# Patient Record
Sex: Male | Born: 1975 | Race: Asian | Hispanic: No | Marital: Married | Smoking: Former smoker
Health system: Southern US, Community
[De-identification: ages and names within clinical notes are randomized; demographics above are authoritative.]

## PROBLEM LIST (undated history)

## (undated) DIAGNOSIS — M25562 Pain in left knee: Secondary | ICD-10-CM

## (undated) DIAGNOSIS — R197 Diarrhea, unspecified: Secondary | ICD-10-CM

## (undated) DIAGNOSIS — E119 Type 2 diabetes mellitus without complications: Secondary | ICD-10-CM

## (undated) DIAGNOSIS — B001 Herpesviral vesicular dermatitis: Secondary | ICD-10-CM

## (undated) DIAGNOSIS — G4730 Sleep apnea, unspecified: Secondary | ICD-10-CM

## (undated) DIAGNOSIS — K625 Hemorrhage of anus and rectum: Secondary | ICD-10-CM

## (undated) DIAGNOSIS — K219 Gastro-esophageal reflux disease without esophagitis: Secondary | ICD-10-CM

## (undated) DIAGNOSIS — L304 Erythema intertrigo: Secondary | ICD-10-CM

## (undated) DIAGNOSIS — Z8719 Personal history of other diseases of the digestive system: Secondary | ICD-10-CM

## (undated) DIAGNOSIS — R0683 Snoring: Secondary | ICD-10-CM

## (undated) DIAGNOSIS — J4520 Mild intermittent asthma, uncomplicated: Secondary | ICD-10-CM

## (undated) DIAGNOSIS — M25561 Pain in right knee: Secondary | ICD-10-CM

## (undated) DIAGNOSIS — F419 Anxiety disorder, unspecified: Secondary | ICD-10-CM

## (undated) DIAGNOSIS — M79639 Pain in unspecified forearm: Secondary | ICD-10-CM

## (undated) HISTORY — PX: OTHER SURGICAL HISTORY: SHX169

## (undated) HISTORY — DX: Type 2 diabetes mellitus without complications: E11.9

## (undated) HISTORY — DX: Anxiety disorder, unspecified: F41.9

---

## 2007-05-05 ENCOUNTER — Emergency Department: Payer: Self-pay | Admitting: Emergency Medicine

## 2009-06-10 ENCOUNTER — Emergency Department: Payer: Self-pay | Admitting: Emergency Medicine

## 2012-06-24 ENCOUNTER — Ambulatory Visit: Payer: Self-pay

## 2014-07-28 ENCOUNTER — Emergency Department: Payer: Self-pay | Admitting: Emergency Medicine

## 2014-08-11 DIAGNOSIS — B001 Herpesviral vesicular dermatitis: Secondary | ICD-10-CM | POA: Insufficient documentation

## 2015-10-25 DIAGNOSIS — Z6837 Body mass index (BMI) 37.0-37.9, adult: Secondary | ICD-10-CM | POA: Insufficient documentation

## 2015-10-25 DIAGNOSIS — E119 Type 2 diabetes mellitus without complications: Secondary | ICD-10-CM | POA: Insufficient documentation

## 2015-10-25 DIAGNOSIS — R030 Elevated blood-pressure reading, without diagnosis of hypertension: Secondary | ICD-10-CM | POA: Insufficient documentation

## 2016-03-05 ENCOUNTER — Encounter: Admission: RE | Payer: Self-pay | Source: Ambulatory Visit

## 2016-03-05 ENCOUNTER — Encounter: Payer: Self-pay | Admitting: Anesthesiology

## 2016-03-05 ENCOUNTER — Ambulatory Visit
Admission: RE | Admit: 2016-03-05 | Payer: BLUE CROSS/BLUE SHIELD | Source: Ambulatory Visit | Admitting: Gastroenterology

## 2016-03-05 SURGERY — COLONOSCOPY WITH PROPOFOL
Anesthesia: General

## 2016-03-08 DIAGNOSIS — K625 Hemorrhage of anus and rectum: Secondary | ICD-10-CM | POA: Insufficient documentation

## 2016-03-08 DIAGNOSIS — K529 Noninfective gastroenteritis and colitis, unspecified: Secondary | ICD-10-CM | POA: Insufficient documentation

## 2016-03-08 DIAGNOSIS — R1084 Generalized abdominal pain: Secondary | ICD-10-CM | POA: Insufficient documentation

## 2016-08-14 ENCOUNTER — Ambulatory Visit
Admission: RE | Admit: 2016-08-14 | Discharge: 2016-08-14 | Disposition: A | Discharge status: Home / Self Care | Payer: BLUE CROSS/BLUE SHIELD | Source: Ambulatory Visit | Attending: Internal Medicine | Admitting: Internal Medicine

## 2016-08-14 ENCOUNTER — Other Ambulatory Visit: Payer: Self-pay | Admitting: Internal Medicine

## 2016-08-14 DIAGNOSIS — J929 Pleural plaque without asbestos: Secondary | ICD-10-CM | POA: Insufficient documentation

## 2016-08-14 DIAGNOSIS — R1031 Right lower quadrant pain: Secondary | ICD-10-CM

## 2016-08-14 MED ORDER — IOPAMIDOL (ISOVUE-300) INJECTION 61%
100.0000 mL | Freq: Once | INTRAVENOUS | Status: AC | PRN
  Administered 2016-08-14: 100 mL via INTRAVENOUS

## 2016-11-12 ENCOUNTER — Encounter: Payer: Self-pay | Admitting: Emergency Medicine

## 2016-11-12 ENCOUNTER — Emergency Department
Admission: EM | Admit: 2016-11-12 | Discharge: 2016-11-12 | Disposition: A | Discharge status: Home / Self Care | Payer: BLUE CROSS/BLUE SHIELD | Attending: Emergency Medicine | Admitting: Emergency Medicine

## 2016-11-12 DIAGNOSIS — Z79899 Other long term (current) drug therapy: Secondary | ICD-10-CM | POA: Diagnosis not present

## 2016-11-12 DIAGNOSIS — L0231 Cutaneous abscess of buttock: Secondary | ICD-10-CM | POA: Insufficient documentation

## 2016-11-12 DIAGNOSIS — F17220 Nicotine dependence, chewing tobacco, uncomplicated: Secondary | ICD-10-CM | POA: Insufficient documentation

## 2016-11-12 DIAGNOSIS — Z7984 Long term (current) use of oral hypoglycemic drugs: Secondary | ICD-10-CM | POA: Insufficient documentation

## 2016-11-12 DIAGNOSIS — E119 Type 2 diabetes mellitus without complications: Secondary | ICD-10-CM | POA: Insufficient documentation

## 2016-11-12 HISTORY — DX: Type 2 diabetes mellitus without complications: E11.9

## 2016-11-12 LAB — BASIC METABOLIC PANEL
ANION GAP: 8 (ref 5–15)
BUN: 16 mg/dL (ref 6–20)
CALCIUM: 9.1 mg/dL (ref 8.9–10.3)
CHLORIDE: 101 mmol/L (ref 101–111)
CO2: 24 mmol/L (ref 22–32)
Creatinine, Ser: 0.74 mg/dL (ref 0.61–1.24)
GFR calc Af Amer: >60 mL/min (ref >60)
GFR calc non Af Amer: >60 mL/min (ref >60)
GLUCOSE: 132 mg/dL — AB (ref 65–99)
Potassium: 4 mmol/L (ref 3.5–5.1)
Sodium: 133 mmol/L — ABNORMAL LOW (ref 135–145)

## 2016-11-12 LAB — CBC
HEMATOCRIT: 41.1 % (ref 40.0–52.0)
HEMOGLOBIN: 13.8 g/dL (ref 13.0–18.0)
MCH: 27.9 pg (ref 26.0–34.0)
MCHC: 33.5 g/dL (ref 32.0–36.0)
MCV: 83.4 fL (ref 80.0–100.0)
Platelets: 206 10*3/uL (ref 150–440)
RBC: 4.93 MIL/uL (ref 4.40–5.90)
RDW: 13.7 % (ref 11.5–14.5)
WBC: 19 10*3/uL — AB (ref 3.8–10.6)

## 2016-11-12 LAB — URINALYSIS, COMPLETE (UACMP) WITH MICROSCOPIC
BILIRUBIN URINE: NEGATIVE
Bacteria, UA: NONE SEEN
GLUCOSE, UA: NEGATIVE mg/dL
HGB URINE DIPSTICK: NEGATIVE
KETONES UR: NEGATIVE mg/dL
LEUKOCYTES UA: NEGATIVE
NITRITE: NEGATIVE
PH: 6 (ref 5.0–8.0)
PROTEIN: NEGATIVE mg/dL
RBC / HPF: NONE SEEN RBC/hpf (ref 0–5)
Specific Gravity, Urine: 1.01 (ref 1.005–1.030)

## 2016-11-12 MED ORDER — CLINDAMYCIN PHOSPHATE 900 MG/50ML IV SOLN
900.0000 mg | Freq: Once | INTRAVENOUS | Status: AC
  Administered 2016-11-12: 900 mg via INTRAVENOUS
  Filled 2016-11-12: qty 50

## 2016-11-12 MED ORDER — IBUPROFEN 600 MG PO TABS
600.0000 mg | ORAL_TABLET | Freq: Once | ORAL | Status: AC
  Administered 2016-11-12: 600 mg via ORAL
  Filled 2016-11-12: qty 1

## 2016-11-12 MED ORDER — SODIUM CHLORIDE 0.9 % IV BOLUS (SEPSIS)
1000.0000 mL | Freq: Once | INTRAVENOUS | Status: AC
  Administered 2016-11-12: 1000 mL via INTRAVENOUS

## 2016-11-12 MED ORDER — CLINDAMYCIN HCL 300 MG PO CAPS: 300.0000 mg | ORAL_CAPSULE | Freq: Three times a day (TID) | ORAL | 0 refills | Status: AC

## 2016-11-12 NOTE — ED Provider Notes (Signed)
[facility] Emergency Department Provider Note  ____________________________________________   First MD Initiated Contact with Patient 11/12/16 [phone]     (approximate)  I have reviewed the triage vital signs and the nursing notes.   HISTORY  Chief Complaint Fever; Chills; and Abscess    HPI Ronald Sparks is a 40 y.o. male presents to the emergency department with recurring abscess on his left inner buttocks. Patient states this is been happening for several years patient states that he's noted a tenderness in this area of the past few weeks with intermittent drainage. Patient admits to fever today MAXIMUM TEMPERATURE at home 102.1 patient presents to the emergency department febrile 101.7 in the edition patient tachycardic with a heart rate of 122. She states that he notified his primary care provider of the abscess however no I&D or antibiotics was performed.   Past Medical History:  Diagnosis Date  . Diabetes mellitus without complication (HCC)     There are no active problems to display for this patient.   History reviewed. No pertinent surgical history.  Prior to Admission medications   Medication Sig Start Date End Date Taking? Authorizing Provider  albuterol (PROVENTIL HFA;VENTOLIN HFA) 108 (90 Base) MCG/ACT inhaler Inhale 2 puffs into the lungs every 4 (four) hours as needed for wheezing or shortness of breath.   Yes Historical Provider, MD  pioglitazone (ACTOS) 30 MG tablet Take 30 mg by mouth daily.   Yes Historical Provider, MD  tiZANidine (ZANAFLEX) 2 MG tablet Take 2-4 mg by mouth every 6 (six) hours as needed for muscle spasms.   Yes Historical Provider, MD  valACYclovir (VALTREX) 1000 MG tablet Take 1,000 mg by mouth once as needed.   Yes Historical Provider, MD  clindamycin (CLEOCIN) 300 MG capsule Take 1 capsule (300 mg total) by mouth 3 (three) times daily. 11/12/16 11/22/16  Darci Currentandolph N Ranessa Kosta, MD    Allergies Aspirin  History  reviewed. No pertinent family history.  Social History Social History  Substance Use Topics  . Smoking status: Never Smoker  . Smokeless tobacco: Current User    Types: Chew  . Alcohol use Yes    Review of Systems Constitutional: Positive for fever/chills Eyes: No visual changes. ENT: No sore throat. Cardiovascular: Denies chest pain. Respiratory: Denies shortness of breath. Gastrointestinal: No abdominal pain.  No nausea, no vomiting.  No diarrhea.  No constipation. Genitourinary: Negative for dysuria. Musculoskeletal: Negative for back pain. Skin: Positive for left gluteal abscess Neurological: Negative for headaches, focal weakness or numbness.  10-point ROS otherwise negative.  ____________________________________________   PHYSICAL EXAM:  VITAL SIGNS: ED Triage Vitals  Enc Vitals Group     BP 11/12/16 0252 (!) 149/80     Pulse Rate 11/12/16 0252 (!) 122     Resp 11/12/16 0430 17     Temp 11/12/16 0252 (!) 101.7 F (38.7 C)     Temp Source 11/12/16 0252 Oral     SpO2 11/12/16 0252 97 %     Weight 11/12/16 0259 268 lb (121.6 kg)     Height 11/12/16 0259 5' 7.5" (1.715 m)     Head Circumference --      Peak Flow --      Pain Score 11/12/16 0300 2     Pain Loc --      Pain Edu? --      Excl. in GC? --     Constitutional: Alert and oriented. Well appearing and in no acute distress. Eyes: Conjunctivae are normal.  PERRL. EOMI. Head: Atraumatic. Mouth/Throat: Mucous membranes are moist.  Oropharynx non-erythematous. Neck: No stridor.  Cardiovascular: Tachycardia regular rhythm. Good peripheral circulation. Grossly normal heart sounds. Respiratory: Normal respiratory effort.  No retractions. Lungs CTAB. Gastrointestinal: Soft and nontender. No distention.  Musculoskeletal: No lower extremity tenderness nor edema. No gross deformities of extremities. Neurologic:  Normal speech and language. No gross focal neurologic deficits are appreciated.  Skin:  Skin is  warm, dry. Left inferior gluteal fold 3 cm wound with scant purulent drainage. No surrounding flocculence or induration.   ____________________________________________   LABS (all labs ordered are listed, but only abnormal results are displayed)  Labs Reviewed  CBC - Abnormal; Notable for the following:       Result Value   WBC 19.0 (*)    All other components within normal limits  BASIC METABOLIC PANEL - Abnormal; Notable for the following:    Sodium 133 (*)    Glucose, Bld 132 (*)    All other components within normal limits  URINALYSIS, COMPLETE (UACMP) WITH MICROSCOPIC - Abnormal; Notable for the following:    Color, Urine YELLOW (*)    APPearance CLEAR (*)    Squamous Epithelial / LPF 0-5 (*)    All other components within normal limits  CULTURE, BLOOD (ROUTINE X 2)  CULTURE, BLOOD (ROUTINE X 2)  AEROBIC CULTURE (SUPERFICIAL SPECIMEN)     Procedures   Critical Care performed: CRITICAL CARE Performed by: Darci CurrentANDOLPH N Kjersten Ormiston   Total critical care time: 30 minutes  Critical care time was exclusive of separately billable procedures and treating other patients.  Critical care was necessary to treat or prevent imminent or life-threatening deterioration.  Critical care was time spent personally by me on the following activities: development of treatment plan with patient and/or surrogate as well as nursing, discussions with consultants, evaluation of patient's response to treatment, examination of patient, obtaining history from patient or surrogate, ordering and performing treatments and interventions, ordering and review of laboratory studies, ordering and review of radiographic studies, pulse oximetry and re-evaluation of patient's condition. ____________________________________________   INITIAL IMPRESSION / ASSESSMENT AND PLAN / ED COURSE  Pertinent labs & imaging results that were available during my care of the patient were reviewed by me and considered in my medical  decision making (see chart for details).  Given history physical exam concern for possible sepsis secondary to left gluteal abscess. Patient tachycardic febrile with leukocytosis. As such patient given IV antibiotics clindamycin 900 mg. I recommended admission to the patient however he requested to try oral antibiotics at home. Gave the patient strict precautions that would want return to the emergency department immediately. No I&D was performed as the left gluteal wound was actively draining   Clinical Course     ____________________________________________  FINAL CLINICAL IMPRESSION(S) / ED DIAGNOSES  Final diagnoses:  Abscess of buttock, left     MEDICATIONS GIVEN DURING THIS VISIT:  Medications  ibuprofen (ADVIL,MOTRIN) tablet 600 mg (600 mg Oral Given 11/12/16 0334)  sodium chloride 0.9 % bolus 1,000 mL (0 mLs Intravenous Stopped 11/12/16 0533)  clindamycin (CLEOCIN) IVPB 900 mg (0 mg Intravenous Stopped 11/12/16 0533)     NEW OUTPATIENT MEDICATIONS STARTED DURING THIS VISIT:  Discharge Medication List as of 11/12/2016  6:51 AM    START taking these medications   Details  clindamycin (CLEOCIN) 300 MG capsule Take 1 capsule (300 mg total) by mouth 3 (three) times daily., Starting Mon 11/12/2016, Until Thu 11/22/2016, Print  Discharge Medication List as of 11/12/2016  6:51 AM      Discharge Medication List as of 11/12/2016  6:51 AM       Note:  This document was prepared using Dragon voice recognition software and may include unintentional dictation errors.    Darci Current, MD 11/14/16 [phone]

## 2016-11-12 NOTE — ED Triage Notes (Signed)
Pt says he has a recurring abscess to his inner left buttock for several years; pt says the area flared up in the last few weeks and has had intermittent drainage; tender on palpation; no drainage noted at present; pt says he started having a fever about 2 hours ago, 102.1 at home;

## 2016-11-14 LAB — AEROBIC CULTURE W GRAM STAIN (SUPERFICIAL SPECIMEN)

## 2016-11-14 LAB — AEROBIC CULTURE  (SUPERFICIAL SPECIMEN): SPECIAL REQUESTS: NORMAL

## 2016-11-17 LAB — CULTURE, BLOOD (ROUTINE X 2)
CULTURE: NO GROWTH
CULTURE: NO GROWTH

## 2017-01-23 DIAGNOSIS — J4520 Mild intermittent asthma, uncomplicated: Secondary | ICD-10-CM | POA: Insufficient documentation

## 2017-01-23 DIAGNOSIS — R030 Elevated blood-pressure reading, without diagnosis of hypertension: Secondary | ICD-10-CM | POA: Insufficient documentation

## 2017-01-23 DIAGNOSIS — L304 Erythema intertrigo: Secondary | ICD-10-CM | POA: Insufficient documentation

## 2017-12-31 DIAGNOSIS — R635 Abnormal weight gain: Secondary | ICD-10-CM | POA: Insufficient documentation

## 2017-12-31 DIAGNOSIS — R0683 Snoring: Secondary | ICD-10-CM | POA: Insufficient documentation

## 2018-04-01 DIAGNOSIS — G8929 Other chronic pain: Secondary | ICD-10-CM | POA: Insufficient documentation

## 2018-04-01 DIAGNOSIS — M25511 Pain in right shoulder: Secondary | ICD-10-CM

## 2018-04-05 DIAGNOSIS — F411 Generalized anxiety disorder: Secondary | ICD-10-CM | POA: Insufficient documentation

## 2018-06-28 IMAGING — CT CT ABD-PELV W/ CM
2 of 5 series · 16 of 46 positions shown, 18 images · IV contrast (APPLIED)
Comparison: None.

CLINICAL DATA: Right lower quadrant abdominal pain for 4 days after
lifting injury.

EXAM:
CT ABDOMEN AND PELVIS WITH CONTRAST
TECHNIQUE: Multidetector CT imaging of the abdomen and pelvis was performed
using the standard protocol following bolus administration of
intravenous contrast.
CONTRAST:  100mL YNWAN9-JII IOPAMIDOL (YNWAN9-JII) INJECTION 61%

[Series 2: axial st · axial · 0.97mm/px · z∈[-431,+39]mm · 13 of 106 slices shown, 15 images]
[im 6/106  soft-tissue]
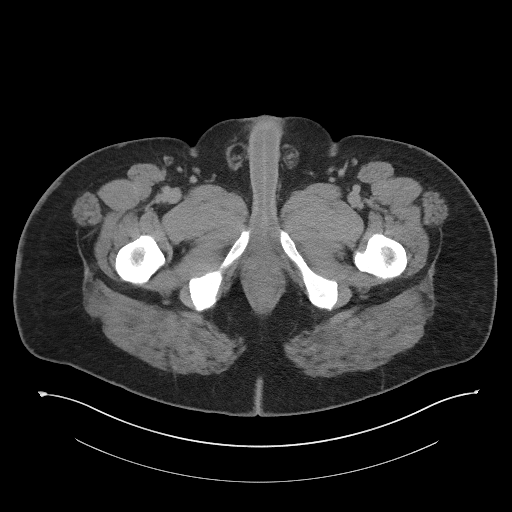
[im 6/106  bone]
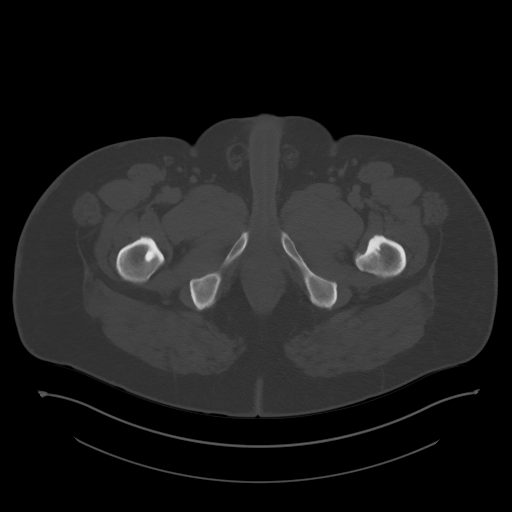
[im 17/106  soft-tissue]
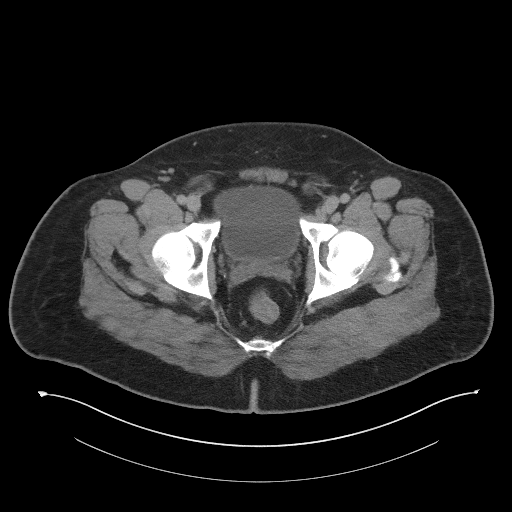
[im 23/106  soft-tissue]
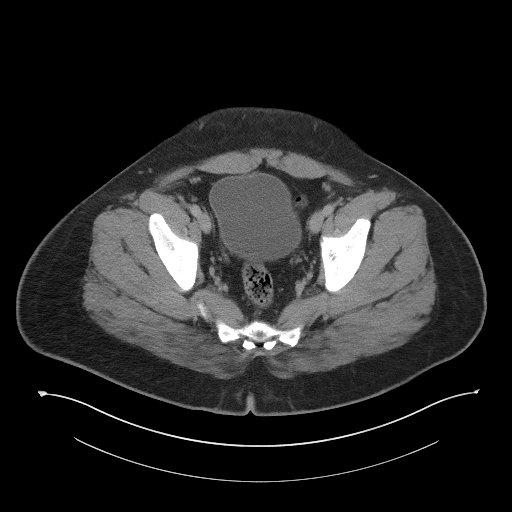
[im 28/106  soft-tissue]
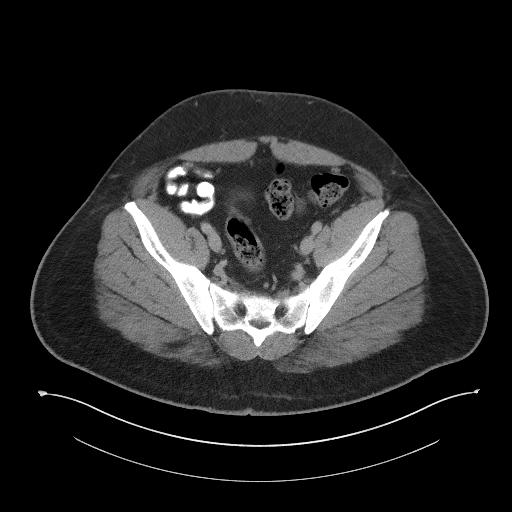
[im 39/106  soft-tissue]
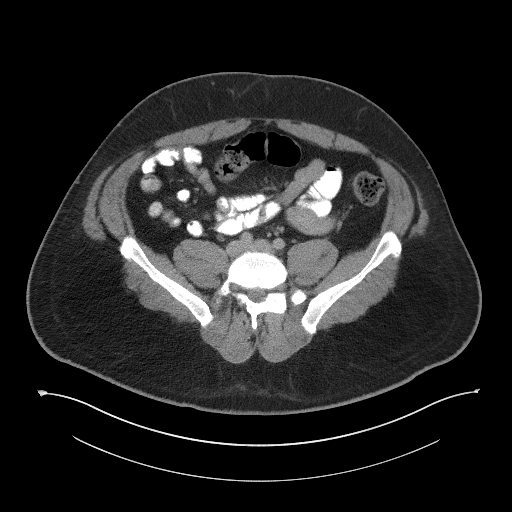
[im 45/106  soft-tissue]
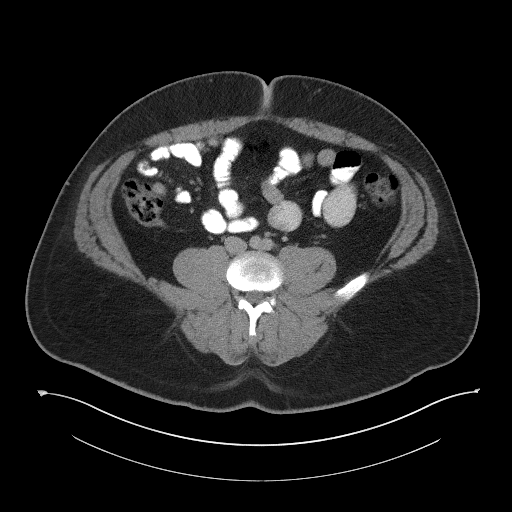
[im 56/106  soft-tissue]
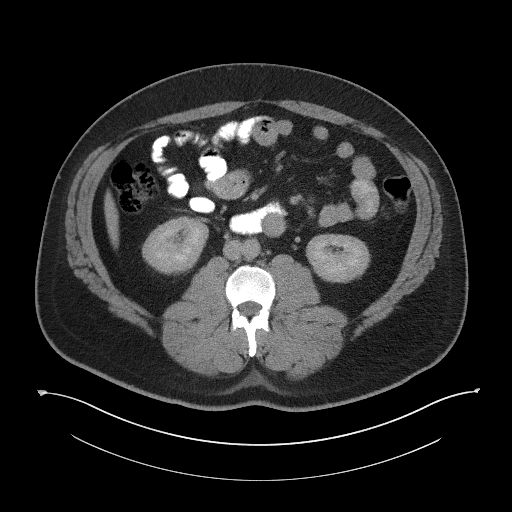
[im 61/106  soft-tissue]
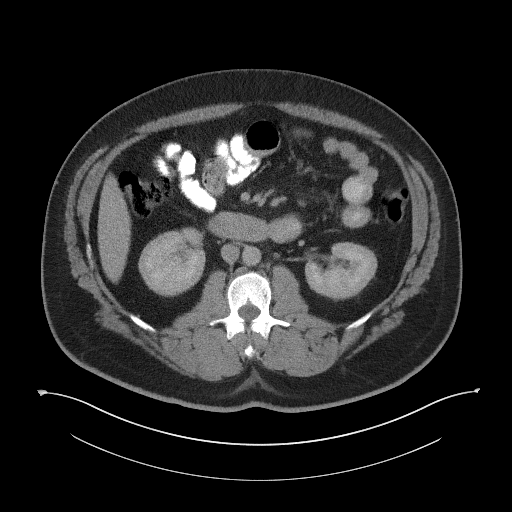
[im 67/106  soft-tissue]
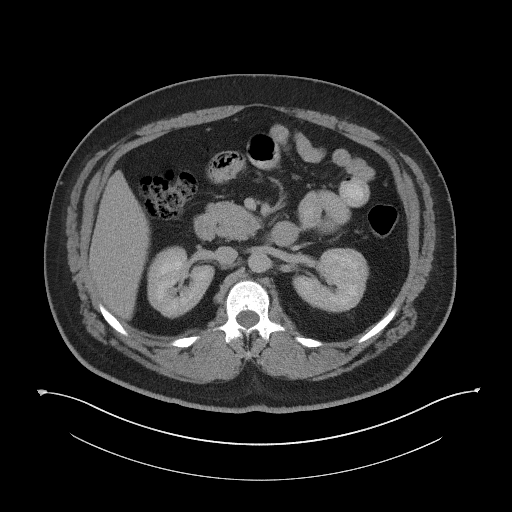
[im 67/106  bone]
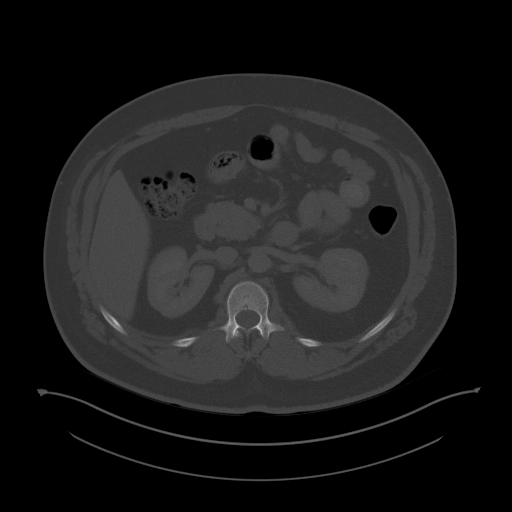
[im 78/106  soft-tissue]
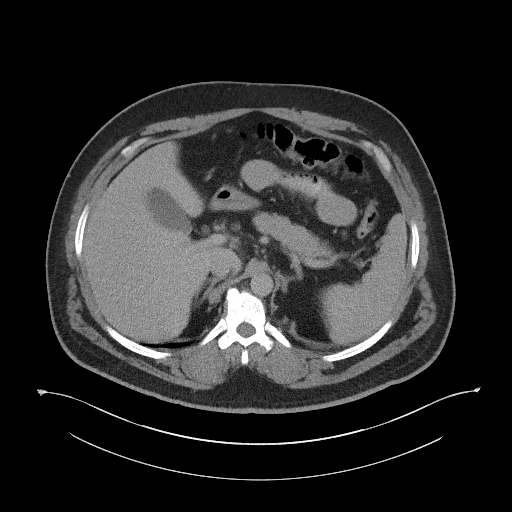
[im 83/106  soft-tissue]
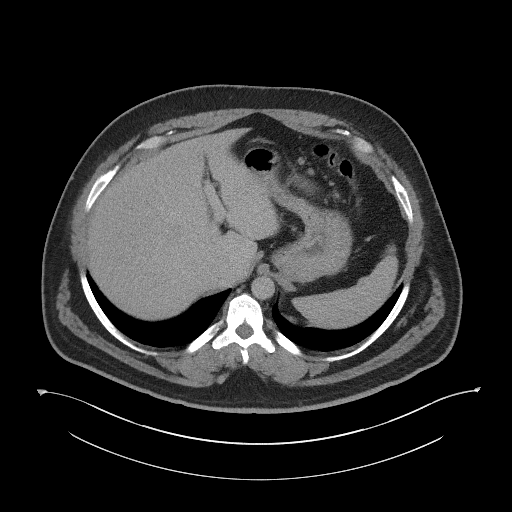
[im 89/106  soft-tissue]
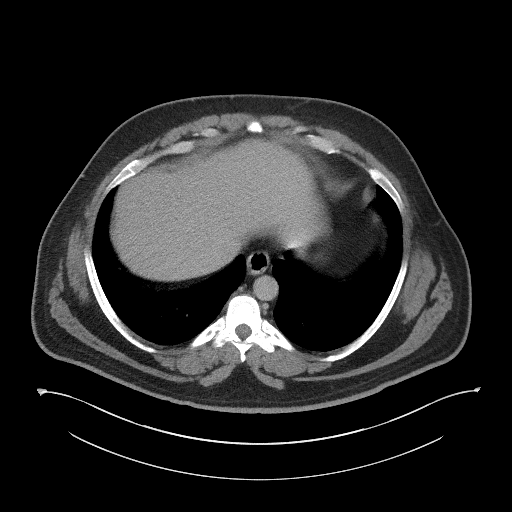
[im 100/106  soft-tissue]
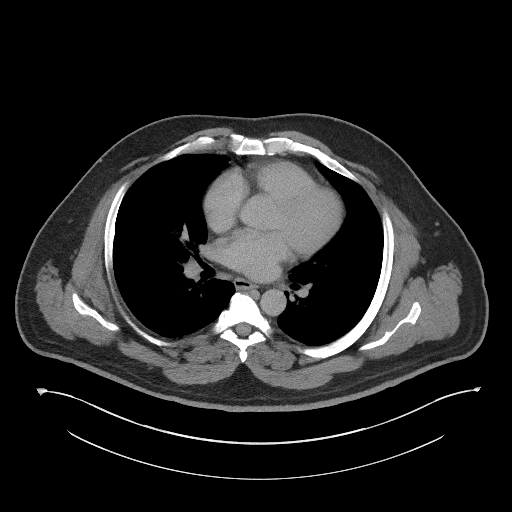

[Series 6: coronal st · coronal · 0.85mm/px · 3 of 112 slices shown]
[im 38/112  soft-tissue]
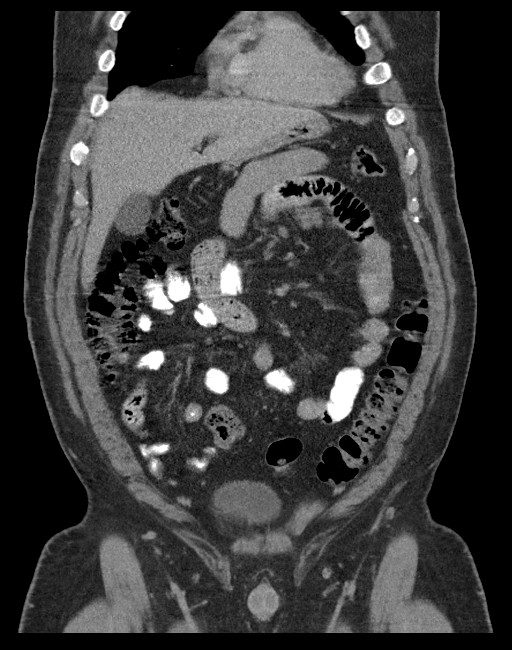
[im 50/112  soft-tissue]
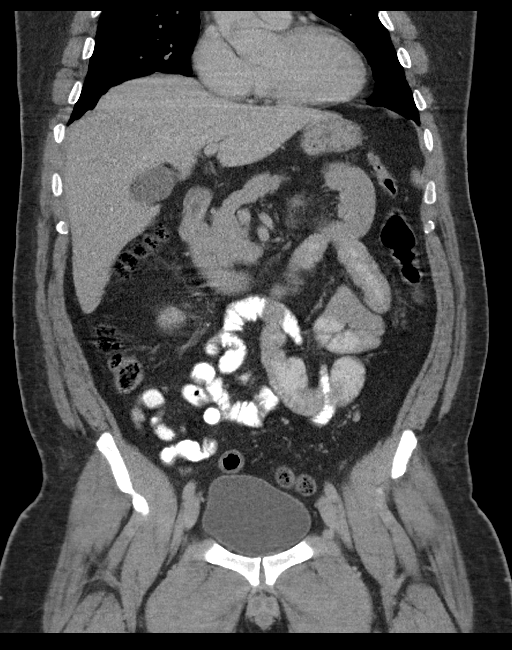
[im 62/112  soft-tissue]
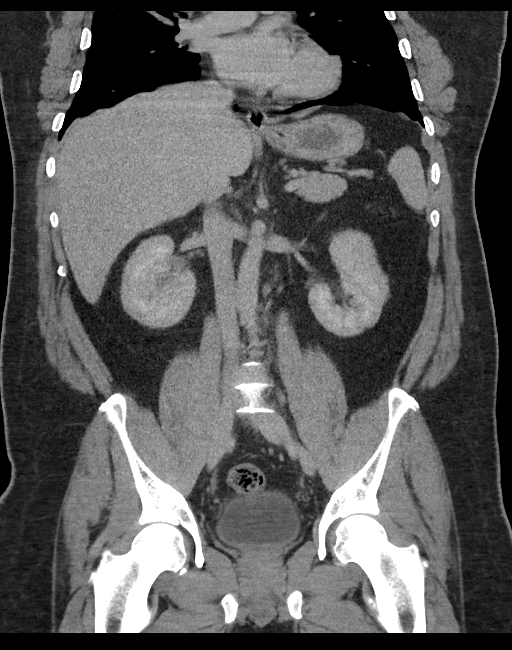

[16 of 46 positions shown; findings below may reference images not displayed]

FINDINGS: Lower chest: No significant pulmonary nodules or acute consolidative
airspace disease. Nonspecific small calcified pleural plaque at the
lateral left lung base.

Hepatobiliary: Normal liver with no liver mass. Normal gallbladder
with no radiopaque cholelithiasis. No biliary ductal dilatation.

Pancreas: Normal, with no mass or duct dilation.

Spleen: Normal size. No mass.

Adrenals/Urinary Tract: Normal adrenals. Simple 1.0 cm renal cyst in
the interpolar left kidney. No hydronephrosis. no additional renal
lesions. Normal bladder.

Stomach/Bowel: Grossly normal stomach. Normal caliber small bowel
with no small bowel wall thickening. Normal appendix. No definite
large bowel wall thickening or pericolonic fat stranding.

Vascular/Lymphatic: Normal caliber abdominal aorta. Patent portal,
splenic, hepatic and renal veins. No pathologically enlarged lymph
nodes in the abdomen or pelvis.

Reproductive: Normal size prostate and seminal vesicles.

Other: No pneumoperitoneum, ascites or focal fluid collection.

Musculoskeletal: No aggressive appearing focal osseous lesions.
Nonspecific sclerotic lesions in the bilateral pelvic girdle,
probably benign bone islands. Mild thoracolumbar spondylosis.
IMPRESSION: 1. No acute abnormality. No evidence of bowel obstruction or acute
bowel inflammation. Normal appendix.
2. Small nonspecific calcified pleural plaque at the lateral left
lung base, which could be asbestos related or due to remote left
pleural insult (infection, trauma).
These results will be called to the ordering clinician or
representative by the [HOSPITAL] at the imaging location.

## 2018-06-30 DIAGNOSIS — R197 Diarrhea, unspecified: Secondary | ICD-10-CM | POA: Insufficient documentation

## 2018-08-05 ENCOUNTER — Other Ambulatory Visit: Payer: Self-pay

## 2018-08-05 ENCOUNTER — Encounter: Payer: Self-pay | Admitting: Psychiatry

## 2018-08-05 ENCOUNTER — Ambulatory Visit: Payer: BLUE CROSS/BLUE SHIELD | Admitting: Psychiatry

## 2018-08-05 VITALS — BP 122/80 | HR 76 | Temp 97.6°F | Wt 265.2 lb

## 2018-08-05 DIAGNOSIS — F411 Generalized anxiety disorder: Secondary | ICD-10-CM

## 2018-08-05 DIAGNOSIS — G4701 Insomnia due to medical condition: Secondary | ICD-10-CM | POA: Diagnosis not present

## 2018-08-05 DIAGNOSIS — F1011 Alcohol abuse, in remission: Secondary | ICD-10-CM | POA: Diagnosis not present

## 2018-08-05 DIAGNOSIS — S335XXA Sprain of ligaments of lumbar spine, initial encounter: Secondary | ICD-10-CM | POA: Insufficient documentation

## 2018-08-05 MED ORDER — HYDROXYZINE PAMOATE 25 MG PO CAPS: 25.0000 mg | ORAL_CAPSULE | Freq: Every day | ORAL | 1 refills | Status: DC | PRN

## 2018-08-05 NOTE — Patient Instructions (Signed)
Hydroxyzine capsules or tablets What is this medicine? HYDROXYZINE (hye DROX i zeen) is an antihistamine. This medicine is used to treat allergy symptoms. It is also used to treat anxiety and tension. This medicine can be used with other medicines to induce sleep before surgery. This medicine may be used for other purposes; ask your health care provider or pharmacist if you have questions. COMMON BRAND NAME(S): ANX, Atarax, Rezine, Vistaril What should I tell my health care provider before I take this medicine? They need to know if you have any of these conditions: -any chronic illness -difficulty passing urine -glaucoma -heart disease -kidney disease -liver disease -lung disease -an unusual or allergic reaction to hydroxyzine, cetirizine, other medicines, foods, dyes, or preservatives -pregnant or trying to get pregnant -breast-feeding How should I use this medicine? Take this medicine by mouth with a full glass of water. Follow the directions on the prescription label. You may take this medicine with food or on an empty stomach. Take your medicine at regular intervals. Do not take your medicine more often than directed. Talk to your pediatrician regarding the use of this medicine in children. Special care may be needed. While this drug may be prescribed for children as young as 6 years of age for selected conditions, precautions do apply. Patients over 65 years old may have a stronger reaction and need a smaller dose. Overdosage: If you think you have taken too much of this medicine contact a poison control center or emergency room at once. NOTE: This medicine is only for you. Do not share this medicine with others. What if I miss a dose? If you miss a dose, take it as soon as you can. If it is almost time for your next dose, take only that dose. Do not take double or extra doses. What may interact with this medicine? -alcohol -barbiturate medicines for sleep or seizures -medicines for  colds, allergies -medicines for depression, anxiety, or emotional disturbances -medicines for pain -medicines for sleep -muscle relaxants This list may not describe all possible interactions. Give your health care provider a list of all the medicines, herbs, non-prescription drugs, or dietary supplements you use. Also tell them if you smoke, drink alcohol, or use illegal drugs. Some items may interact with your medicine. What should I watch for while using this medicine? Tell your doctor or health care professional if your symptoms do not improve. You may get drowsy or dizzy. Do not drive, use machinery, or do anything that needs mental alertness until you know how this medicine affects you. Do not stand or sit up quickly, especially if you are an older patient. This reduces the risk of dizzy or fainting spells. Alcohol may interfere with the effect of this medicine. Avoid alcoholic drinks. Your mouth may get dry. Chewing sugarless gum or sucking hard candy, and drinking plenty of water may help. Contact your doctor if the problem does not go away or is severe. This medicine may cause dry eyes and blurred vision. If you wear contact lenses you may feel some discomfort. Lubricating drops may help. See your eye doctor if the problem does not go away or is severe. If you are receiving skin tests for allergies, tell your doctor you are using this medicine. What side effects may I notice from receiving this medicine? Side effects that you should report to your doctor or health care professional as soon as possible: -fast or irregular heartbeat -difficulty passing urine -seizures -slurred speech or confusion -tremor Side effects that   usually do not require medical attention (report to your doctor or health care professional if they continue or are bothersome): -constipation -drowsiness -fatigue -headache -stomach upset This list may not describe all possible side effects. Call your doctor for  medical advice about side effects. You may report side effects to FDA at 1-800-FDA-1088. Where should I keep my medicine? Keep out of the reach of children. Store at room temperature between 15 and 30 degrees C (59 and 86 degrees F). Keep container tightly closed. Throw away any unused medicine after the expiration date. NOTE: This sheet is a summary. It may not cover all possible information. If you have questions about this medicine, talk to your doctor, pharmacist, or health care provider.  2018 Elsevier/Gold Standard (2008-03-19 14:50:59)  

## 2018-08-05 NOTE — Progress Notes (Signed)
Psychiatric Initial Adult Assessment   Patient Identification: Ronald Sparks MRN:  [id number] Date of Evaluation:  08/05/2018 Referral Source: Dr.Jasmine Thedore Mins Chief Complaint:  ' I am here to establish care." Chief Complaint    Establish Care; Anxiety; Stress; Agitation; Fussy     Visit Diagnosis:    ICD-10-CM   1. GAD (generalized anxiety disorder) F41.1   2. Insomnia due to medical condition G47.01   3. 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  Patient reports he has been struggling with anger issues all his life.  He reports he feels his anger issues are getting worse since the past few months.  He reports it may be also since he is struggling physically.  He was diagnosed with diabetes mellitus  and was tried on different medications.  He reports all of those medications gave him side effects like diarrhea.  He reports he hence has not been able to sleep at night since he spends a lot of time using the bathroom due to his GI issues.  Patient reports his work schedule is also complicated since some days he has to work till 4 AM and has to return to work again in the morning at 8 AM.  Patient reports since he is not able to manage his sleep schedule, that also has had an impact on his sleep.  Patient reports he has been struggling to wind down after coming back from work and has difficulty falling asleep.    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  He reports he worries about everything to the extreme.  He reports he feels anxious all the time, has been unable to stop worrying, worries too much about different things, has trouble relaxing, has been restless on and off, feels afraid something awful might happen and has irritability and anger management issues.  Patient reports his  anxiety symptoms as worsening since the past few months.  He reports he was prescribed Xanax by his primary medical doctor which he rarely takes.  Patient denies any depressive symptoms.  Patient denies any bipolar symptoms.  Patient denies any history of trauma.  Patient denies any perceptual disturbances.  Patient does have psychosocial stressors of work related stressors as well as his own medical problems.  Patient does report a history of alcoholism in his 13s.  He however reports he went into the 12-step program and does not use alcohol as much.  Associated Signs/Symptoms: Depression Symptoms:  insomnia, anxiety, (Hypo) Manic Symptoms:  Irritable Mood, Anxiety Symptoms:  Excessive Worry, Psychotic Symptoms:  denies PTSD Symptoms: Negative  Past Psychiatric History: Pt denies past treatment hx for mental illness. Denies suicide attempts.  Previous Psychotropic Medications: Yes xanax  Substance Abuse History in the last 12 months:  No.  Consequences of Substance Abuse: Negative  Past Medical History:  Past Medical History:  Diagnosis Date  . Anxiety   . Diabetes mellitus without complication (HCC)   . Diabetes mellitus, type II (HCC)    History reviewed. No pertinent surgical history.  Family Psychiatric History: Father has dementia  Family History:  Family History  Problem Relation Age of Onset  . Dementia Father     Social History:   Social History   Socioeconomic History  . Marital status: Married    Spouse name: jasmine  .  Number of children: 3  . Years of education: Not on file  . Highest education level: Some college, no degree  Occupational History  . Not on file  Social Needs  . Financial resource strain: Not hard at all  . Food insecurity:    Worry: Never true    Inability: Never true  . Transportation needs:    Medical: No    Non-medical: No  Tobacco Use  . Smoking status: Former Smoker    Types: Cigarettes    Last attempt to quit:  08/06/2007    Years since quitting: 11.0  . Smokeless tobacco: Former Neurosurgeon    Types: Chew    Quit date: 08/05/2016  Substance and Sexual Activity  . Alcohol use: Yes    Alcohol/week: 4.0 standard drinks    Types: 4 Cans of beer per week  . Drug use: No  . Sexual activity: Not on file  Lifestyle  . Physical activity:    Days per week: 0 days    Minutes per session: 0 min  . Stress: Rather much  Relationships  . Social connections:    Talks on phone: Not on file    Gets together: Not on file    Attends religious service: More than 4 times per year    Active member of club or organization: No    Attends meetings of clubs or organizations: Never    Relationship status: Married  Other Topics Concern  . Not on file  Social History Narrative  . Not on file    Additional Social History: Pt is married, lives in [place] with his wife and children.  He has 3 boys aged 17, 39, 4.  He works as a Art therapist at [employer].  He reports he had a good childhood.  Allergies:   Allergies  Allergen Reactions  . Aspirin Nausea And Vomiting    Metabolic Disorder Labs: No results found for: HGBA1C, MPG No results found for: PROLACTIN No results found for: CHOL, TRIG, HDL, CHOLHDL, VLDL, LDLCALC   Current Medications: Current Outpatient Medications  Medication Sig Dispense Refill  . Dulaglutide 0.75 MG/0.5ML SOPN Inject into the skin.    . hydrOXYzine (VISTARIL) 25 MG capsule Take 1-2 capsules (25-50 mg total) by mouth daily as needed. For anxiety and sleep 60 capsule 1  . tiZANidine (ZANAFLEX) 2 MG tablet Take 2-4 mg by mouth every 6 (six) hours as needed for muscle spasms.    . valACYclovir (VALTREX) 1000 MG tablet Take 1,000 mg by mouth once as needed.     No current facility-administered medications for this visit.     Neurologic: Headache: No Seizure: No Paresthesias:No  Musculoskeletal: Strength & Muscle Tone: within normal limits Gait & Station: normal Patient leans:  N/A  Psychiatric Specialty Exam: Review of Systems  Psychiatric/Behavioral: The patient is nervous/anxious and has insomnia.   All other systems reviewed and are negative.   Blood pressure 122/80, pulse 76, temperature 97.6 F (36.4 C), temperature source Oral, weight 265 lb 3.2 oz (120.3 kg).Body mass index is 40.92 kg/m.  General Appearance: Casual  Eye Contact:  Fair  Speech:  Normal Rate  Volume:  Normal  Mood:  Anxious  Affect:  Congruent  Thought Process:  Goal Directed and Descriptions of Associations: Intact  Orientation:  Full (Time, Place, and Person)  Thought Content:  Logical  Suicidal Thoughts:  No  Homicidal Thoughts:  No  Memory:  Immediate;   Fair Recent;   Fair Remote;   Fair  Judgement:  Fair  Insight:  Fair  Psychomotor Activity:  Normal  Concentration:  Concentration: Fair and Attention Span: Fair  Recall:  FiservFair  Fund of Knowledge:Fair  Language: Fair  Akathisia:  No  Handed:  Right  AIMS (if indicated): na  Assets:  Communication Skills Desire for Improvement Social Support  ADL's:  Intact  Cognition: WNL  Sleep:  poor    Treatment Plan Summary:Josejulian is a 42 year old male, employed, married, lives in [place], has a history of anger issues, sleep problems, diabetes mellitus, presented to the clinic today to establish care.  Patient is biologically predisposed given his history of substance abuse problems in the past, currently in remission as well as mental health problems in his family.  Patient also has psychosocial stressors of his own medical problems, side effects to medications as well as work related stressors.  Patient however is motivated to start psychotherapy.  Patient also has good social support system.  Patient denies any suicidality or homicidality.  Plan as noted below. Medication management and Plan as noted below  Plan  Generalized anxiety disorder GAD 7 equals 21 Discussed starting an SSRI however patient currently  struggles with GI problems, diarrhea due to being on medications for his diabetes mellitus.  Hence discussed with patient that since SSRIs can also cause GI issues , to wait for few weeks before starting medications like SSRIs.  Patient however reports he is not interested in medications at all right now and would like to take something as needed.  Patient is not interested in continuing the Xanax. Start hydroxyzine 25-50 mg p.o. daily as needed for severe anxiety symptoms Refer for CBT.  For insomnia Discussed sleep hygiene techniques. Patient is not interested in a sleep aid and hence discussed to continue hydroxyzine as needed.  Alcohol use disorder in sustained remission We will continue to monitor, patient uses only socially  [facility]within normal limits.  Follow-up in clinic in 4 weeks or sooner if needed.  More than 50 % of the time was spent for psychoeducation and supportive psychotherapy and care coordination.  This note was generated in part or whole with voice recognition software. Voice recognition is usually quite accurate but there are transcription errors that can and very often do occur. I apologize for any typographical errors that were not detected and corrected.       Jomarie LongsSaramma Ramesha Poster, MD 9/17/20192:44 PM

## 2018-08-13 ENCOUNTER — Encounter: Payer: Self-pay | Admitting: Licensed Clinical Social Worker

## 2018-08-13 ENCOUNTER — Ambulatory Visit: Payer: BLUE CROSS/BLUE SHIELD | Admitting: Licensed Clinical Social Worker

## 2018-08-13 DIAGNOSIS — F411 Generalized anxiety disorder: Secondary | ICD-10-CM
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
## 2018-08-28 ENCOUNTER — Ambulatory Visit: Payer: BLUE CROSS/BLUE SHIELD | Admitting: Licensed Clinical Social Worker

## 2018-09-02 ENCOUNTER — Ambulatory Visit: Payer: BLUE CROSS/BLUE SHIELD | Admitting: Psychiatry

## 2018-09-04 ENCOUNTER — Ambulatory Visit: Payer: BLUE CROSS/BLUE SHIELD | Admitting: Licensed Clinical Social Worker

## 2018-09-04 ENCOUNTER — Encounter: Payer: Self-pay | Admitting: Licensed Clinical Social Worker

## 2018-09-04 DIAGNOSIS — F411 Generalized anxiety disorder: Secondary | ICD-10-CM | POA: Diagnosis not present
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
## 2018-09-10 ENCOUNTER — Ambulatory Visit (INDEPENDENT_AMBULATORY_CARE_PROVIDER_SITE_OTHER): Payer: BLUE CROSS/BLUE SHIELD | Admitting: Psychiatry

## 2018-09-10 ENCOUNTER — Encounter: Payer: Self-pay | Admitting: Psychiatry

## 2018-09-10 ENCOUNTER — Other Ambulatory Visit: Payer: Self-pay

## 2018-09-10 VITALS — BP 118/75 | HR 79 | Temp 98.8°F | Wt 263.8 lb

## 2018-09-10 DIAGNOSIS — G4701 Insomnia due to medical condition: Secondary | ICD-10-CM

## 2018-09-10 DIAGNOSIS — F411 Generalized anxiety disorder: Secondary | ICD-10-CM | POA: Diagnosis not present

## 2018-09-10 DIAGNOSIS — F1011 Alcohol abuse, in remission: Secondary | ICD-10-CM | POA: Diagnosis not present

## 2018-09-10 NOTE — Progress Notes (Signed)
BH MD OP Progress Note  09/10/2018 11:00 AM Ronald Sparks  MRN:  [id number]  Chief Complaint:  Chief Complaint    Anxiety; Depression     ' I am here for follow up.'  HPI: Ronald Sparks is a 42 year old male, lives in [place], employed, has a history of anxiety, anger management problems, diabetes mellitus, presented to the clinic today for a follow-up visit.   Reports he has started therapy sessions with Ms. [name].  He had 2 sessions so far.  He reports he has been trying to practice what he learns at the sessions to cope with his mood lability.  He reports he believes his mood symptoms are getting better.  He continues to work on his sleep.  His work schedule is complicated since some days he has to work till 4 AM and has to return to work again in the morning at 8 AM.  Patient reports last night he got 5-6 hours of sleep which is an improvement for him.  He will continue to work on this.  He reports he has not used any of the hydroxyzine provided to him yet.  He wants to try using his coping techniques before trying any medications.  He continues to use alcohol only socially.  He denies any suicidality or homicidality.  He reports he was taken off of his diabetes medications and his blood sugar levels are better now.  He reports that has been a relief for him.  Patient denies any other concerns today.    Visit Diagnosis:    ICD-10-CM   1. GAD (generalized anxiety disorder) F41.1   2. Insomnia due to medical condition G47.01   3. 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  Past Medical History:  Past Medical History:  Diagnosis Date  . Anxiety   . Diabetes mellitus without complication (HCC)   . Diabetes mellitus, type II (HCC)    History reviewed. No pertinent surgical history.  Family Psychiatric History: Have reviewed family psychiatric history  from my progress note on 08/05/2018.  Family History:  Family History  Problem Relation Age of Onset  . Dementia Father     Social History: Have reviewed social history from my progress note on 08/05/2018. Social History   Socioeconomic History  . Marital status: Married    Spouse name: jasmine  . Number of children: 3  . Years of education: Not on file  . Highest education level: Some college, no degree  Occupational History  . Not on file  Social Needs  . Financial resource strain: Not hard at all  . Food insecurity:    Worry: Never true    Inability: Never true  . Transportation needs:    Medical: No    Non-medical: No  Tobacco Use  . Smoking status: Former Smoker    Types: Cigarettes    Last attempt to quit: 08/06/2007    Years since quitting: 11.1  . Smokeless tobacco: Former Neurosurgeon    Types: Chew    Quit date: 08/05/2016  Substance and Sexual Activity  . Alcohol use: Yes    Alcohol/week: 4.0 standard drinks    Types: 4 Cans of beer per week  . Drug use: No  . Sexual activity: Not on file  Lifestyle  . Physical activity:    Days per week: 0 days    Minutes per session: 0 min  . Stress:  Rather much  Relationships  . Social connections:    Talks on phone: Not on file    Gets together: Not on file    Attends religious service: More than 4 times per year    Active member of club or organization: No    Attends meetings of clubs or organizations: Never    Relationship status: Married  Other Topics Concern  . Not on file  Social History Narrative  . Not on file    Allergies:  Allergies  Allergen Reactions  . Aspirin Nausea And Vomiting    Metabolic Disorder Labs: No results found for: HGBA1C, MPG No results found for: PROLACTIN No results found for: CHOL, TRIG, HDL, CHOLHDL, VLDL, LDLCALC No results found for: TSH  Therapeutic Level Labs: No results found for: LITHIUM No results found for: VALPROATE No components found for:  CBMZ  Current  Medications: Current Outpatient Medications  Medication Sig Dispense Refill  . hydrOXYzine (VISTARIL) 25 MG capsule Take 1-2 capsules (25-50 mg total) by mouth daily as needed. For anxiety and sleep 60 capsule 1  . tiZANidine (ZANAFLEX) 2 MG tablet Take 2-4 mg by mouth every 6 (six) hours as needed for muscle spasms.    . valACYclovir (VALTREX) 1000 MG tablet Take 1,000 mg by mouth once as needed.     No current facility-administered medications for this visit.      Musculoskeletal: Strength & Muscle Tone: within normal limits Gait & Station: normal Patient leans: N/A  Psychiatric Specialty Exam: Review of Systems  Psychiatric/Behavioral: The patient is nervous/anxious and has insomnia.   All other systems reviewed and are negative.   Blood pressure 118/75, pulse 79, temperature 98.8 F (37.1 C), temperature source Oral, weight 263 lb 12.8 oz (119.7 kg).Body mass index is 40.71 kg/m.  General Appearance: Casual  Eye Contact:  Fair  Speech:  Clear and Coherent  Volume:  Normal  Mood:  Anxious  Affect:  Appropriate  Thought Process:  Goal Directed and Descriptions of Associations: Intact  Orientation:  Full (Time, Place, and Person)  Thought Content: Logical   Suicidal Thoughts:  No  Homicidal Thoughts:  No  Memory:  Immediate;   Fair Recent;   Fair Remote;   Fair  Judgement:  Fair  Insight:  Fair  Psychomotor Activity:  Normal  Concentration:  Concentration: Fair and Attention Span: Fair  Recall:  [employer] of Knowledge: Fair  Language: Fair  Akathisia:  No  Handed:  Right  AIMS (if indicated):na  Assets:  Communication Skills Desire for Improvement Housing Social Support Talents/Skills Transportation Vocational/Educational  ADL's:  Intact  Cognition: WNL  Sleep:  restless   Screenings:   Assessment and Plan: Ronald Sparks is a 42 year old male, employed, married, lives in [place], has a history of anger issues, sleep problem, diabetes mellitus, presented  to the clinic today for a follow-up visit.  Patient is biologically predisposed given his history of substance abuse problems in the past currently in remission as well as mental health problems in his family.  He also has psychosocial stressors of his own medical problems, side effects to medications, work-related stressors and so on.  Patient currently is no longer on diabetes medication which is a relief for him.  Patient has started psychotherapy sessions. Pt reports psychotherapy sessions is beneficial.  He also also working on his sleep schedule.  He currently denies any suicidality or homicidality.  Will continue plan as noted below.  Plan Generalized anxiety disorder Patient is not interested in an  SSRI at this time. Continue hydroxyzine as needed.  He has not used any hydroxyzine yet.  Insomnia Discussed sleep hygiene techniques. Patient will continue to work on his sleep schedule. He also has hydroxyzine as needed .  Alcohol use disorder in sustained remission We will continue to monitor, patient uses only socially.  Pt will continue psychotherapy sessions with Ms. Tasia Catchings.  Follow-up in clinic in 1 month or sooner if needed.  More than 50 % of the time was spent for psychoeducation and supportive psychotherapy and care coordination.  This note was generated in part or whole with voice recognition software. Voice recognition is usually quite accurate but there are transcription errors that can and very often do occur. I apologize for any typographical errors that were not detected and corrected.        Jomarie Longs, MD 09/10/2018, 11:00 AM

## 2018-10-01 ENCOUNTER — Ambulatory Visit: Payer: BLUE CROSS/BLUE SHIELD | Admitting: Licensed Clinical Social Worker

## 2018-10-01 ENCOUNTER — Encounter: Payer: Self-pay | Admitting: Licensed Clinical Social Worker

## 2018-10-01 DIAGNOSIS — F411 Generalized anxiety disorder: Secondary | ICD-10-CM | POA: Diagnosis not present

## 2018-10-01 NOTE — Progress Notes (Signed)
   THERAPIST PROGRESS NOTE  Session Time: 130  Participation Level: Active  Behavioral Response: NeatAlertNA  Type of Therapy: Individual Therapy  Treatment Goals addressed: Anger  Interventions: CBT  Summary: Ronald Sparks is a 42 y.o. male who presents with symptoms related to his diagnosis. Ronald Sparks reports things have been going well over the last month, and "no real anger outbursts to report." Ronald Sparks reports utilizing positive reinforcement with his children to attempt to modify their behaviors, which he states has been working. He reports utilizing challenging negative thoughts to manage his anxiety and anger, which he also states has been working. He reports work has been going well, and his family dynamics with his father and mother have improved as well. He stated his father had a stroke six days ago, but he is recovering well.   Suicidal/Homicidal: No  Therapist Response: Ronald Sparks continues to be able to speak openly during therapy sessions; however, he is not able to maintain eye contact during his sessions. He asked to move to every three month sessions, which I agreed to and asked him to move his appointment if he needed a session prior to three months. [name] expressed understanding and agreement with this information.   Plan: Return again in 3 months.   Diagnosis: Axis I: Generalized Anxiety Disorder    Axis II: No diagnosis    Heidi DachKelsey Braydon Kullman, LCSW 10/01/2018

## 2018-10-06 ENCOUNTER — Ambulatory Visit: Payer: BLUE CROSS/BLUE SHIELD | Admitting: Psychiatry

## 2018-12-23 ENCOUNTER — Ambulatory Visit: Payer: BLUE CROSS/BLUE SHIELD | Admitting: Licensed Clinical Social Worker

## 2019-09-14 ENCOUNTER — Other Ambulatory Visit: Payer: Self-pay

## 2019-09-14 ENCOUNTER — Encounter: Payer: BLUE CROSS/BLUE SHIELD | Attending: Surgery | Admitting: Dietician

## 2019-09-14 ENCOUNTER — Encounter: Payer: Self-pay | Admitting: Dietician

## 2019-09-14 VITALS — Ht 68.0 in | Wt 281.1 lb

## 2019-09-14 DIAGNOSIS — E119 Type 2 diabetes mellitus without complications: Secondary | ICD-10-CM

## 2019-09-14 NOTE — Patient Instructions (Signed)
   Good job making healthy food choices and reducing portions of starchy foods like rice and bread. Keep up the good work!  Start some morning exercise, OK to start with a short time such as 15 minutes and gradually increase.   Continue to work on achieving a healthy balance in meals, including protein + 45-60grams carb + low-carb vegetables.   Can try a protein drink with about 15-30grams carb (or less) to substitute for one meal daily (or less often). You will want the drink to contain 20-30grams of protein

## 2019-09-14 NOTE — Progress Notes (Signed)
Medical Nutrition Therapy: Visit start time: 0930  end time: 1050  Assessment:  Diagnosis: Type 2 diabetes Past medical history: obesity Psychosocial issues/ stress concerns: anxiety/ high stress level  Preferred learning method:  . No preference indicated  Current weight: 281.1lbs (with shoes and jacket) Height: 5'8" Medications, supplements: reconciled list in medical record  Progress and evaluation:   Patient reports fluctuations in HbAic in past 1-2 years.   He reports he is currently at his highest weight, voices frustration with difficulty losing weight.   He works at [employer] but states he does not eat pizza often; does work some long shifts of 10 hours (standing) and is usually too tired to exercise.   Lost 20-30lbs several years ago by eating more salads and walking on treadmill including incline walking.  He had worked 2nd shift for about 20 years, and was eating full meal at 2-3am but now is working first shift, 9am - 6-7pm, eats about 8pm.   Uses some Stevia sweetener  Physical activity: on-the-job, standing mostly  Dietary Intake:  Usual eating pattern includes 3 meals and 1-2 snacks per day. Dining out frequency: 0 meals per week.  Breakfast: 9-10am -- 3 eggs + pita bread; 1/2 waffle + coffee Snack: none Lunch: recently nuts only due to lack of time for meal; occasional salad Snack: none regularly Supper: 8pm-- curry and pita (naan), smaller portions rice + veg Snack: small portion of something sweet Beverages: 2-3c coffee; water (thinks he needs to increase); 10oz diet soda 2-3 times a week  Nutrition Care Education: Topics covered: Diabetes, weight control Basic nutrition: basic food groups, appropriate nutrient balance, appropriate meal and snack schedule, general nutrition guidelines    Weight control: importance of low sugar and low fat choices, portion control strategies including knowledge of appropriate portions, increasing portions of low-carb veg to  compensate; estimated energy needs at 1700 kcal, provided guidance for 45% CHO, 25% protein, and 30% fat Diabetes:  appropriate meal and snack schedule, appropriate carb intake and balance, healthy carb choices, role of fiber, protein, fat   Nutritional Diagnosis:  [place]-2.2 Altered nutrition-related laboratory As related to Type 2 diabetes.  As evidenced by patient with recent HbA1C of 8.6%. [place]-3.3 Overweight/obesity As related to excess calories and inadequate physical activity.  As evidenced by patient with current BMI of 42, working on diet changes to promote weight loss.  Intervention:   Instruction and discussion as noted above.  Patient has begun making positive dietary changes, has not yet experienced weight loss.   Established goals for further changes for BG improvement and weight loss.    Education Materials given:  . General diet guidelines for Diabetes . Plate Planner with food lists and meal pattern . Sample menus . Goals/ instructions   Learner/ who was taught:  . Patient   Level of understanding: Marland Kitchen Verbalizes/ demonstrates competency   Demonstrated degree of understanding via:   Teach back Learning barriers: . None  Willingness to learn/ readiness for change: . Eager, change in progress  Monitoring and Evaluation:  Dietary intake, exercise, BG control, and body weight      follow up: 12/14/19 at 9:30am

## 2019-12-14 ENCOUNTER — Ambulatory Visit: Payer: BLUE CROSS/BLUE SHIELD | Admitting: Dietician

## 2020-01-28 ENCOUNTER — Encounter: Payer: Self-pay | Admitting: Dietician

## 2020-01-28 NOTE — Progress Notes (Signed)
Have not heard back from patient to reschedule his cancelled appointment from 12/14/19. Sent notification to referring provider.

## 2020-02-04 ENCOUNTER — Ambulatory Visit: Payer: Self-pay | Attending: Internal Medicine

## 2020-02-04 DIAGNOSIS — Z23 Encounter for immunization: Secondary | ICD-10-CM

## 2020-02-04 NOTE — Progress Notes (Signed)
   Covid-19 Vaccination Clinic  Name:  Ronald Sparks    MRN: [id number] DOB: Aug 13, 1976  02/04/2020  [name] was observed post Covid-19 immunization for 15 minutes without incident. He was provided with Vaccine Information Sheet and instruction to access the V-Safe system.   [name] was instructed to call 911 with any severe reactions post vaccine: Marland Kitchen Difficulty breathing  . Swelling of face and throat  . A fast heartbeat  . A bad rash all over body  . Dizziness and weakness   Immunizations Administered    Name Date Dose VIS Date Route   Pfizer COVID-19 Vaccine 02/04/2020 10:24 AM 0.3 mL 10/30/2019 Intramuscular   Manufacturer: [employer], [employer]   Lot: [id number]   NDC: [address]-5

## 2020-03-01 ENCOUNTER — Ambulatory Visit: Payer: Self-pay | Attending: Internal Medicine

## 2020-03-01 DIAGNOSIS — Z23 Encounter for immunization: Secondary | ICD-10-CM

## 2020-03-01 NOTE — Progress Notes (Signed)
   Covid-19 Vaccination Clinic  Name:  Ronald Sparks    MRN: [id number] DOB: 07-16-1976  03/01/2020  Ronald Sparks was observed post Covid-19 immunization for 15 minutes without incident. He was provided with Vaccine Information Sheet and instruction to access the V-Safe system.   Ronald Sparks was instructed to call 911 with any severe reactions post vaccine: Marland Kitchen Difficulty breathing  . Swelling of face and throat  . A fast heartbeat  . A bad rash all over body  . Dizziness and weakness   Immunizations Administered    Name Date Dose VIS Date Route   Pfizer COVID-19 Vaccine 03/01/2020 10:06 AM 0.3 mL 10/30/2019 Intramuscular   Manufacturer: [employer], [employer]   Lot: [id number]   NDC: [address]-8

## 2020-03-11 ENCOUNTER — Other Ambulatory Visit
Admission: RE | Admit: 2020-03-11 | Discharge: 2020-03-11 | Disposition: A | Discharge status: Home / Self Care | Payer: 59 | Source: Ambulatory Visit | Attending: Internal Medicine | Admitting: Internal Medicine

## 2020-03-11 ENCOUNTER — Encounter: Payer: Self-pay | Admitting: Internal Medicine

## 2020-03-11 DIAGNOSIS — Z20822 Contact with and (suspected) exposure to covid-19: Secondary | ICD-10-CM | POA: Insufficient documentation

## 2020-03-11 DIAGNOSIS — Z01812 Encounter for preprocedural laboratory examination: Secondary | ICD-10-CM | POA: Diagnosis present

## 2020-03-11 LAB — SARS CORONAVIRUS 2 (TAT 6-24 HRS): SARS Coronavirus 2: NEGATIVE

## 2020-03-14 ENCOUNTER — Ambulatory Visit: Payer: 59 | Admitting: Anesthesiology

## 2020-03-14 ENCOUNTER — Encounter: Payer: Self-pay | Admitting: Internal Medicine

## 2020-03-14 ENCOUNTER — Other Ambulatory Visit: Payer: Self-pay

## 2020-03-14 ENCOUNTER — Ambulatory Visit
Admission: RE | Admit: 2020-03-14 | Discharge: 2020-03-14 | Disposition: A | Discharge status: Home / Self Care | Payer: 59 | Attending: Internal Medicine | Admitting: Internal Medicine

## 2020-03-14 ENCOUNTER — Encounter
Admission: RE | Disposition: A | Discharge status: Home / Self Care | Payer: Self-pay | Source: Home / Self Care | Attending: Internal Medicine

## 2020-03-14 DIAGNOSIS — B9681 Helicobacter pylori [H. pylori] as the cause of diseases classified elsewhere: Secondary | ICD-10-CM | POA: Diagnosis not present

## 2020-03-14 DIAGNOSIS — E119 Type 2 diabetes mellitus without complications: Secondary | ICD-10-CM | POA: Insufficient documentation

## 2020-03-14 DIAGNOSIS — D500 Iron deficiency anemia secondary to blood loss (chronic): Secondary | ICD-10-CM | POA: Insufficient documentation

## 2020-03-14 DIAGNOSIS — Z87891 Personal history of nicotine dependence: Secondary | ICD-10-CM | POA: Insufficient documentation

## 2020-03-14 DIAGNOSIS — Z7984 Long term (current) use of oral hypoglycemic drugs: Secondary | ICD-10-CM | POA: Diagnosis not present

## 2020-03-14 DIAGNOSIS — K269 Duodenal ulcer, unspecified as acute or chronic, without hemorrhage or perforation: Secondary | ICD-10-CM | POA: Diagnosis not present

## 2020-03-14 DIAGNOSIS — K2951 Unspecified chronic gastritis with bleeding: Secondary | ICD-10-CM | POA: Diagnosis not present

## 2020-03-14 DIAGNOSIS — Z79899 Other long term (current) drug therapy: Secondary | ICD-10-CM | POA: Diagnosis not present

## 2020-03-14 HISTORY — DX: Pain in unspecified forearm: M79.639

## 2020-03-14 HISTORY — PX: ESOPHAGOGASTRODUODENOSCOPY (EGD) WITH PROPOFOL: SHX5813

## 2020-03-14 LAB — GLUCOSE, CAPILLARY: Glucose-Capillary: 105 mg/dL — ABNORMAL HIGH (ref 70–99)

## 2020-03-14 SURGERY — ESOPHAGOGASTRODUODENOSCOPY (EGD) WITH PROPOFOL
Anesthesia: General

## 2020-03-14 MED ORDER — PROPOFOL 10 MG/ML IV BOLUS
INTRAVENOUS | Status: DC | PRN
  Administered 2020-03-14: 100 mg via INTRAVENOUS

## 2020-03-14 MED ORDER — GLYCOPYRROLATE 0.2 MG/ML IJ SOLN
INTRAMUSCULAR | Status: AC
  Filled 2020-03-14: qty 1

## 2020-03-14 MED ORDER — PROPOFOL 500 MG/50ML IV EMUL
INTRAVENOUS | Status: DC | PRN
  Administered 2020-03-14: 120 ug/kg/min via INTRAVENOUS

## 2020-03-14 MED ORDER — MIDAZOLAM HCL 2 MG/2ML IJ SOLN
INTRAMUSCULAR | Status: AC
  Filled 2020-03-14: qty 2

## 2020-03-14 MED ORDER — GLYCOPYRROLATE 0.2 MG/ML IJ SOLN
INTRAMUSCULAR | Status: DC | PRN
  Administered 2020-03-14: .2 mg via INTRAVENOUS

## 2020-03-14 MED ORDER — LIDOCAINE 2% (20 MG/ML) 5 ML SYRINGE
INTRAMUSCULAR | Status: DC | PRN
  Administered 2020-03-14: 25 mg via INTRAVENOUS

## 2020-03-14 MED ORDER — MIDAZOLAM HCL 5 MG/5ML IJ SOLN
INTRAMUSCULAR | Status: DC | PRN
  Administered 2020-03-14: 2 mg via INTRAVENOUS

## 2020-03-14 MED ORDER — SODIUM CHLORIDE 0.9 % IV SOLN: INTRAVENOUS | Status: DC

## 2020-03-14 NOTE — H&P (Signed)
Outpatient short stay form Pre-procedure 03/14/2020 10:16 AM Ronald Sparks Ronald Sparks, M.D.  Primary Physician: Ronald Minister, NP  Reason for visit:  Melena, heme positive stool, anemia  History of present illness:  Patient presents for the above issues. No further bleeding. Had decreased of Hgb from 13.8 to 9.3.    Current Facility-Administered Medications:  .  0.9 %  sodium chloride infusion, , Intravenous, Continuous, Ronald Sparks, Ronald Nearing, MD  Medications Prior to Admission  Medication Sig Dispense Refill Last Dose  . pantoprazole (PROTONIX) 40 MG tablet Take 40 mg by mouth daily.     . valACYclovir (VALTREX) 1000 MG tablet Take 1,000 mg by mouth 2 (two) times daily.     Marland Kitchen glucose blood test strip Use 2 (two) times daily Use as instructed. BAYER CONTOUR NEXT TEST STRIPS E11.9     . Ibuprofen 200 MG CAPS Take by mouth.     . pioglitazone (ACTOS) 30 MG tablet Take by mouth.     . sitaGLIPtin (JANUVIA) 100 MG tablet Take by mouth.        Allergies  Allergen Reactions  . Aspirin Nausea And Vomiting     Past Medical History:  Diagnosis Date  . Anxiety   . Diabetes mellitus without complication (HCC)   . Diabetes mellitus, type II (HCC)     Review of systems:  Otherwise negative.    Physical Exam  Gen: Alert, oriented. Appears stated age.  HEENT: [place]/AT. PERRLA. Lungs: CTA, no wheezes. CV: RR nl S1, S2. Abd: soft, benign, no masses. BS+ Ext: No edema. Pulses 2+    Planned procedures: Proceed with EGD. The patient understands the nature of the planned procedure, indications, risks, alternatives and potential complications including but not limited to bleeding, infection, perforation, damage to internal organs and possible oversedation/side effects from anesthesia. The patient agrees and gives consent to proceed.  Please refer to procedure notes for findings, recommendations and patient disposition/instructions.     Ronald Sparks Ronald Sparks, M.D. Gastroenterology 03/14/2020   10:16 AM

## 2020-03-14 NOTE — Anesthesia Preprocedure Evaluation (Signed)
Anesthesia Evaluation  Patient identified by MRN, date of birth, ID band Patient awake    Reviewed: Allergy & Precautions, H&P , NPO status , Patient's Chart, lab work & pertinent test results  Airway Mallampati: III  TM Distance: >3 FB Neck ROM: full    Dental  (+) Teeth Intact   Pulmonary asthma , former smoker,    breath sounds clear to auscultation       Cardiovascular (-) angina(-) Past MI negative cardio ROS  (-) dysrhythmias  Rhythm:regular Rate:Normal     Neuro/Psych Anxiety negative neurological ROS  negative psych ROS   GI/Hepatic negative GI ROS, Neg liver ROS,   Endo/Other  diabetes  Renal/GU negative Renal ROS  negative genitourinary   Musculoskeletal   Abdominal   Peds  Hematology negative hematology ROS (+)   Anesthesia Other Findings Past Medical History: No date: Anxiety No date: Diabetes mellitus without complication (HCC) No date: Diabetes mellitus, type II (HCC) No date: Forearm pain  History reviewed. No pertinent surgical history.  BMI    Body Mass Index: 40.72 kg/m      Reproductive/Obstetrics negative OB ROS                             Anesthesia Physical Anesthesia Plan  ASA: II  Anesthesia Plan: General   Post-op Pain Management:    Induction:   PONV Risk Score and Plan: Propofol infusion and TIVA  Airway Management Planned:   Additional Equipment:   Intra-op Plan:   Post-operative Plan:   Informed Consent: I have reviewed the patients History and Physical, chart, labs and discussed the procedure including the risks, benefits and alternatives for the proposed anesthesia with the patient or authorized representative who has indicated his/her understanding and acceptance.     Dental Advisory Given  Plan Discussed with: Anesthesiologist  Anesthesia Plan Comments:         Anesthesia Quick Evaluation

## 2020-03-14 NOTE — Transfer of Care (Signed)
Immediate Anesthesia Transfer of Care Note  Patient: Ronald Sparks  Procedure(s) Performed: ESOPHAGOGASTRODUODENOSCOPY (EGD) WITH PROPOFOL (N/A )  Patient Location: Endoscopy Unit  Anesthesia Type:General  Level of Consciousness: sedated  Airway & Oxygen Therapy: Patient Spontanous Breathing  Post-op Assessment: Report given to RN and Post -op Vital signs reviewed and stable  Post vital signs: Reviewed  Last Vitals:  Vitals Value Taken Time  BP 122/74 03/14/20 1132  Temp 36.2 C 03/14/20 1132  Pulse 75 03/14/20 1132  Resp 14 03/14/20 1132  SpO2 100 % 03/14/20 1132  Vitals shown include unvalidated device data.  Last Pain:  Vitals:   03/14/20 1026  TempSrc: Temporal  PainSc: 0-No pain         Complications: No apparent anesthesia complications

## 2020-03-14 NOTE — Op Note (Addendum)
[facility] Gastroenterology Patient Name: Ronald Sparks Procedure Date: 03/14/2020 11:13 AM MRN: [id number] Account #: [id number] Date of Birth: 04/15/1976 Admit Type: Outpatient Age: 44 Room: [facility] ENDO ROOM 3 Gender: Male Note Status: Finalized Procedure:             Upper GI endoscopy Indications:           Iron deficiency anemia secondary to chronic blood                         loss, Melena Providers:             Boykin Nearing. Norma Fredrickson MD, MD Referring MD:          Marilynne Halsted, MD (Referring MD) Medicines:             Propofol per Anesthesia Complications:         No immediate complications. Estimated blood loss: None. Procedure:             Pre-Anesthesia Assessment:                        - The risks and benefits of the procedure and the                         sedation options and risks were discussed with the                         patient. All questions were answered and informed                         consent was obtained.                        - Patient identification and proposed procedure were                         verified prior to the procedure by the nurse. The                         procedure was verified in the procedure room.                        - ASA Grade Assessment: III - A patient with severe                         systemic disease.                        - After reviewing the risks and benefits, the patient                         was deemed in satisfactory condition to undergo the                         procedure.                        After obtaining informed consent, the endoscope was                         passed  under direct vision. Throughout the procedure,                         the patient's blood pressure, pulse, and oxygen                         saturations were monitored continuously. The Endoscope                         was introduced through the mouth, and advanced to the                         third  part of duodenum. The upper GI endoscopy was                         accomplished without difficulty. The patient tolerated                         the procedure well. Findings:      The esophagus was normal.      Patchy mild inflammation characterized by erythema was found in the       gastric antrum. Biopsies were taken with a cold forceps for Helicobacter       pylori testing.      The cardia and gastric fundus were normal on retroflexion.      One non-bleeding superficial duodenal ulcer with no stigmata of bleeding       was found in the duodenal bulb. The lesion was 5 mm in largest dimension.      The second portion of the duodenum and third portion of the duodenum       were normal.      The exam was otherwise without abnormality. Impression:            - Normal esophagus.                        - Gastritis. Biopsied.                        - Non-bleeding duodenal ulcer with no stigmata of                         bleeding.                        - Normal second portion of the duodenum and third                         portion of the duodenum.                        - The examination was otherwise normal. Recommendation:        - Patient has a contact number available for                         emergencies. The signs and symptoms of potential                         delayed complications were discussed with the patient.  Return to normal activities tomorrow. Written                         discharge instructions were provided to the patient.                        - Resume previous diet.                        - Continue present medications.                        - Await pathology results.                        - No aspirin, ibuprofen, naproxen, or other                         non-steroidal anti-inflammatory drugs.                        - Await pathology results.                        - Return to physician assistant in 3 months.                         - The findings and recommendations were discussed with                         the patient. Procedure Code(s):     --- Professional ---                        [id number], Esophagogastroduodenoscopy, flexible,                         transoral; with biopsy, single or multiple Diagnosis Code(s):     --- Professional ---                        K92.1, Melena (includes Hematochezia)                        D50.0, Iron deficiency anemia secondary to blood loss                         (chronic)                        K26.9, Duodenal ulcer, unspecified as acute or                         chronic, without hemorrhage or perforation                        K29.70, Gastritis, unspecified, without bleeding CPT copyright 2019 American Medical Association. All rights reserved. The codes documented in this report are preliminary and upon coder review may  be revised to meet current compliance requirements. Efrain Sella MD, MD 03/14/2020 11:29:28 AM This report has been signed electronically. Number of Addenda: 0 Note Initiated On: 03/14/2020 11:13 AM Estimated Blood Loss:  Estimated blood loss: none.  [facility]

## 2020-03-15 ENCOUNTER — Encounter: Payer: Self-pay | Admitting: *Deleted

## 2020-03-15 LAB — SURGICAL PATHOLOGY

## 2020-03-15 NOTE — Anesthesia Postprocedure Evaluation (Signed)
Anesthesia Post Note  Patient: Ronald Sparks  Procedure(s) Performed: ESOPHAGOGASTRODUODENOSCOPY (EGD) WITH PROPOFOL (N/A )  Patient location during evaluation: PACU Anesthesia Type: General Level of consciousness: awake and alert Pain management: pain level controlled Vital Signs Assessment: post-procedure vital signs reviewed and stable Respiratory status: spontaneous breathing, nonlabored ventilation and respiratory function stable Cardiovascular status: blood pressure returned to baseline and stable Postop Assessment: no apparent nausea or vomiting Anesthetic complications: no     Last Vitals:  Vitals:   03/14/20 1150 03/14/20 1200  BP: 128/81 122/84  Pulse: 68 74  Resp: 13 18  Temp:    SpO2: 100% 100%    Last Pain:  Vitals:   03/15/20 0733  TempSrc:   PainSc: 0-No pain                 Karleen Hampshire

## 2020-06-09 ENCOUNTER — Inpatient Hospital Stay: Payer: 59

## 2020-06-09 ENCOUNTER — Encounter: Payer: Self-pay | Admitting: Oncology

## 2020-06-09 ENCOUNTER — Inpatient Hospital Stay: Payer: 59 | Attending: Oncology | Admitting: Oncology

## 2020-06-09 ENCOUNTER — Other Ambulatory Visit: Payer: Self-pay

## 2020-06-09 VITALS — BP 113/72 | HR 70 | Temp 96.7°F | Resp 18 | Ht 67.0 in | Wt 268.1 lb

## 2020-06-09 DIAGNOSIS — Z8719 Personal history of other diseases of the digestive system: Secondary | ICD-10-CM | POA: Diagnosis not present

## 2020-06-09 DIAGNOSIS — D500 Iron deficiency anemia secondary to blood loss (chronic): Secondary | ICD-10-CM | POA: Insufficient documentation

## 2020-06-09 DIAGNOSIS — D509 Iron deficiency anemia, unspecified: Secondary | ICD-10-CM | POA: Diagnosis not present

## 2020-06-09 DIAGNOSIS — K269 Duodenal ulcer, unspecified as acute or chronic, without hemorrhage or perforation: Secondary | ICD-10-CM | POA: Diagnosis not present

## 2020-06-09 LAB — RETIC PANEL
Immature Retic Fract: 38.6 % — ABNORMAL HIGH (ref 2.3–15.9)
RBC.: 4.69 MIL/uL (ref 4.22–5.81)
Retic Count, Absolute: 31.4 10*3/uL (ref 19.0–186.0)
Retic Ct Pct: 0.7 % (ref 0.4–3.1)
Reticulocyte Hemoglobin: 18.2 pg — ABNORMAL LOW (ref >27.9)

## 2020-06-09 LAB — CBC WITH DIFFERENTIAL/PLATELET
Abs Immature Granulocytes: 0.07 10*3/uL (ref 0.00–0.07)
Basophils Absolute: 0 10*3/uL (ref 0.0–0.1)
Basophils Relative: 1 %
Eosinophils Absolute: 0.6 10*3/uL — ABNORMAL HIGH (ref 0.0–0.5)
Eosinophils Relative: 7 %
HCT: 29.7 % — ABNORMAL LOW (ref 39.0–52.0)
Hemoglobin: 8.4 g/dL — ABNORMAL LOW (ref 13.0–17.0)
Immature Granulocytes: 1 %
Lymphocytes Relative: 36 %
Lymphs Abs: 2.8 10*3/uL (ref 0.7–4.0)
MCH: 18.1 pg — ABNORMAL LOW (ref 26.0–34.0)
MCHC: 28.3 g/dL — ABNORMAL LOW (ref 30.0–36.0)
MCV: 64 fL — ABNORMAL LOW (ref 80.0–100.0)
Monocytes Absolute: 0.6 10*3/uL (ref 0.1–1.0)
Monocytes Relative: 8 %
Neutro Abs: 3.8 10*3/uL (ref 1.7–7.7)
Neutrophils Relative %: 47 %
Platelets: 291 10*3/uL (ref 150–400)
RBC: 4.64 MIL/uL (ref 4.22–5.81)
RDW: 21.2 % — ABNORMAL HIGH (ref 11.5–15.5)
WBC: 7.9 10*3/uL (ref 4.0–10.5)
nRBC: 0 % (ref 0.0–0.2)

## 2020-06-09 LAB — IRON AND TIBC
Iron: 11 ug/dL — ABNORMAL LOW (ref 45–182)
Saturation Ratios: 2 % — ABNORMAL LOW (ref 17.9–39.5)
TIBC: 564 ug/dL — ABNORMAL HIGH (ref 250–450)
UIBC: 553 ug/dL

## 2020-06-09 LAB — TECHNOLOGIST SMEAR REVIEW: Plt Morphology: NORMAL

## 2020-06-09 LAB — FERRITIN: Ferritin: 2 ng/mL — ABNORMAL LOW (ref 24–336)

## 2020-06-09 NOTE — Progress Notes (Signed)
Hematology/Oncology Consult note [facility] Telephone:([phone] Fax:([phone]   Patient Care Team: Patrice Paradise, MD as PCP - General (Physician Assistant)  REFERRING PROVIDER: Carver Fila, PA-C CHIEF COMPLAINTS/REASON FOR VISIT:  Evaluation of  anemia  HISTORY OF PRESENTING ILLNESS:  Ronald Sparks is a  44 y.o.  male with PMH listed below was seen in consultation at the request of Abe People   for evaluation of iron deficiency anemia.   Reviewed patient's recent labs  7/21 2021labs revealed anemia with hemoglobin of   7.9, MCV 66.1. Reviewed patient's previous labs ordered by primary care physician's office, anemia is chronic onset , duration is since April 2021.   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  Nonbleeding duodenal ulcer with no stigmata of bleeding.  Patient was treated for H. pylori.  Associated signs and symptoms: Patient reports fatigue.  Denies SOB with exertion.  Denies weight loss, easy bruising, hematochezia, hemoptysis, hematuria. Context:  History of iron deficiency: Patient has been suggested by primary care provider recently to start iron tablet twice daily and pantoprazole 40 mg twice daily. Rectal bleeding: Denies any black or bloody stool Hematemesis or hemoptysis : denies Blood in urine : denies      Review of Systems  Constitutional: Positive for fatigue. Negative for appetite change, chills, fever and unexpected weight change.  HENT:   Negative for hearing loss and voice change.   Eyes: Negative for eye problems and icterus.  Respiratory: Negative for chest tightness, cough and shortness of breath.   Cardiovascular: Negative for chest pain and leg swelling.  Gastrointestinal: Negative for abdominal distention and abdominal pain.  Endocrine: Negative for hot  flashes.  Genitourinary: Negative for difficulty urinating, dysuria and frequency.   Musculoskeletal: Negative for arthralgias.  Skin: Negative for itching and rash.  Neurological: Negative for light-headedness and numbness.  Hematological: Negative for adenopathy. Does not bruise/bleed easily.  Psychiatric/Behavioral: Negative for confusion.    MEDICAL HISTORY:  Past Medical History:  Diagnosis Date  . Anxiety   . Diabetes mellitus without complication (HCC)   . Diabetes mellitus, type II (HCC)   . Forearm pain     SURGICAL HISTORY: Past Surgical History:  Procedure Laterality Date  . ESOPHAGOGASTRODUODENOSCOPY (EGD) WITH PROPOFOL N/A 03/14/2020   Procedure: ESOPHAGOGASTRODUODENOSCOPY (EGD) WITH PROPOFOL;  Surgeon: Toledo, Boykin Nearing, MD;  Location: [facility] ENDOSCOPY;  Service: Gastroenterology;  Laterality: N/A;    SOCIAL HISTORY: Social History   Socioeconomic History  . Marital status: Married    Spouse name: jasmine  . Number of children: 3  . Years of education: Not on file  . Highest education level: Some college, no degree  Occupational History  . Not on file  Tobacco Use  . Smoking status: Former Smoker    Packs/day: 1.00    Years: 10.00    Pack years: 10.00    Types: Cigarettes    Quit date: 08/06/2007    Years since quitting: 12.8  . Smokeless tobacco: Former Neurosurgeon    Types: Chew    Quit date: 08/05/2016  Vaping Use  . Vaping Use: Never used  Substance and Sexual Activity  . Alcohol use: Yes    Alcohol/week: 3.0 - 6.0 standard drinks    Types: 1 Cans of beer, 1 - 2 Shots of liquor, 1 - 3 Standard drinks or equivalent per week    Comment: occasional   .  Drug use: No  . Sexual activity: Yes    Birth control/protection: I.U.D.  Other Topics Concern  . Not on file  Social History Narrative  . Not on file   Social Determinants of Health   Financial Resource Strain:   . Difficulty of Paying Living Expenses:   Food Insecurity:   . Worried About Community education officerunning  Out of Food in the Last Year:   . Baristaan Out of Food in the Last Year:   Transportation Needs:   . Freight forwarderLack of Transportation (Medical):   Marland Kitchen. Lack of Transportation (Non-Medical):   Physical Activity:   . Days of Exercise per Week:   . Minutes of Exercise per Session:   Stress:   . Feeling of Stress :   Social Connections:   . Frequency of Communication with Friends and Family:   . Frequency of Social Gatherings with Friends and Family:   . Attends Religious Services:   . Active Member of Clubs or Organizations:   . Attends BankerClub or Organization Meetings:   Marland Kitchen. Marital Status:   Intimate Partner Violence:   . Fear of Current or Ex-Partner:   . Emotionally Abused:   Marland Kitchen. Physically Abused:   . Sexually Abused:     FAMILY HISTORY: Family History  Problem Relation Age of Onset  . Diabetes Mother   . Hypertension Mother   . Sleep apnea Mother   . Dementia Father   . Atrial fibrillation Father   . Diabetes Father     ALLERGIES:  is allergic to aspirin.  MEDICATIONS:  Current Outpatient Medications  Medication Sig Dispense Refill  . glucose blood test strip Use 2 (two) times daily Use as instructed. BAYER CONTOUR NEXT TEST STRIPS E11.9    . Ibuprofen 200 MG CAPS Take by mouth.    . pantoprazole (PROTONIX) 40 MG tablet Take 40 mg by mouth daily.    . pioglitazone (ACTOS) 30 MG tablet Take by mouth.    . sitaGLIPtin (JANUVIA) 100 MG tablet Take by mouth.    . valACYclovir (VALTREX) 1000 MG tablet Take 1,000 mg by mouth 2 (two) times daily.    . ferrous sulfate 325 (65 FE) MG EC tablet Take by mouth. (Patient not taking: Reported on 06/09/2020)     No current facility-administered medications for this visit.     PHYSICAL EXAMINATION: ECOG PERFORMANCE STATUS: 1 - Symptomatic but completely ambulatory Vitals:   06/09/20 1442  BP: 113/72  Pulse: 70  Resp: 18  Temp: (!) 96.7 F (35.9 C)   Filed Weights   06/09/20 1442  Weight: (!) 268 lb 1.6 oz (121.6 kg)    Physical  Exam Constitutional:      General: He is not in acute distress. HENT:     Head: Normocephalic and atraumatic.  Eyes:     General: No scleral icterus. Cardiovascular:     Rate and Rhythm: Normal rate and regular rhythm.     Heart sounds: Normal heart sounds.  Pulmonary:     Effort: Pulmonary effort is normal. No respiratory distress.     Breath sounds: No wheezing.  Abdominal:     General: Bowel sounds are normal. There is no distension.     Palpations: Abdomen is soft.  Musculoskeletal:        General: No deformity. Normal range of motion.     Cervical back: Normal range of motion and neck supple.  Skin:    General: Skin is warm and dry.     Coloration: Skin is  pale.     Findings: No erythema or rash.  Neurological:     Mental Status: He is alert and oriented to person, place, and time. Mental status is at baseline.     Cranial Nerves: No cranial nerve deficit.     Coordination: Coordination normal.  Psychiatric:        Mood and Affect: Mood normal.       CMP Latest Ref Rng & Units 11/12/2016  Glucose 65 - 99 mg/dL 161(W)  BUN 6 - 20 mg/dL 16  Creatinine 9.60 - 4.54 mg/dL 0.98  Sodium 119 - 147 mmol/L 133(L)  Potassium 3.5 - 5.1 mmol/L 4.0  Chloride 101 - 111 mmol/L 101  CO2 22 - 32 mmol/L 24  Calcium 8.9 - 10.3 mg/dL 9.1   CBC Latest Ref Rng & Units 06/09/2020  WBC 4.0 - 10.5 K/uL 7.9  Hemoglobin 13.0 - 17.0 g/dL 8.2(N)  Hematocrit 39 - 52 % 29.7(L)  Platelets 150 - 400 K/uL 291     LABORATORY DATA:  I have reviewed the data as listed Lab Results  Component Value Date   WBC 7.9 06/09/2020   HGB 8.4 (L) 06/09/2020   HCT 29.7 (L) 06/09/2020   MCV 64.0 (L) 06/09/2020   PLT 291 06/09/2020   No results for input(s): NA, K, CL, CO2, GLUCOSE, BUN, CREATININE, CALCIUM, GFRNONAA, GFRAA, PROT, ALBUMIN, AST, ALT, ALKPHOS, BILITOT, BILIDIR, IBILI in the last 8760 hours. Iron/TIBC/Ferritin/ %Sat    Component Value Date/Time   IRON 11 (L) 06/09/2020 1543   TIBC  564 (H) 06/09/2020 1543   FERRITIN 2 (L) 06/09/2020 1543   IRONPCTSAT 2 (L) 06/09/2020 1543     RADIOGRAPHIC STUDIES: I have personally reviewed the radiological images as listed and agreed with the findings in the report. No results found.     ASSESSMENT & PLAN:  1. Iron deficiency anemia due to chronic blood loss   2. History of gastritis    Labs are reviewed and discussed with patient.  Repeat blood work showed hemoglobin 8.4, MCV 64, ferritin 2, iron saturation 2, TIBC 564 consistent with iron deficiency anemia. Plan IV iron with Venofer 200mg  weekly x 4 doses. Allergy reactions/infusion reaction including anaphylactic reaction discussed with patient. Other side effects include but not limited to high blood pressure, skin rash, weight gain, leg swelling, etc. Patient voices understanding and willing to proceed. Patient will need to follow-up with gastroenterology for further evaluation. Continue pantoprazole 40 mg twice daily.  Orders Placed This Encounter  Procedures  . CBC with Differential/Platelet    Standing Status:   Future    Number of Occurrences:   1    Standing Expiration Date:   06/09/2021  . Technologist smear review    Standing Status:   Future    Number of Occurrences:   1    Standing Expiration Date:   06/09/2021  . Retic Panel    Standing Status:   Future    Number of Occurrences:   1    Standing Expiration Date:   06/09/2021  . Ferritin    Standing Status:   Future    Number of Occurrences:   1    Standing Expiration Date:   12/10/2020  . Iron and TIBC    Standing Status:   Future    Number of Occurrences:   1    Standing Expiration Date:   06/09/2021    All questions were answered. The patient knows to call the clinic with any problems  questions or concerns.  Cc Carver Fila, PA-C  Return of visit: 10 weeks Thank you for this kind referral and the opportunity to participate in the care of this patient. A copy of today's note is routed to  referring provider   Rickard Patience, MD, PhD Hematology Oncology [facility] 06/09/2020

## 2020-06-09 NOTE — Progress Notes (Signed)
Patient here to establish care  

## 2020-06-10 ENCOUNTER — Telehealth: Payer: Self-pay

## 2020-06-10 NOTE — Telephone Encounter (Signed)
-----   Message from Rickard Patience, MD sent at 06/09/2020  7:54 PM EDT ----- Please arrange him to get IV venofer weekly x 4. We discussed about the possibility today.  Follow up plan lab in 10 weeks, 1-2 days prior. MD +/- venofer.  Lab has ordered

## 2020-06-10 NOTE — Telephone Encounter (Signed)
Please schedule patient as requested and notify him of appts. Venofer is new. Thanks

## 2020-06-10 NOTE — Telephone Encounter (Signed)
Done.. All appts were scheduled as requested. I was unable to reach by phone a detailed message was left making him aware of all his ached appts. A reminder letter will be mailed out as well

## 2020-06-13 ENCOUNTER — Telehealth: Payer: Self-pay

## 2020-06-13 NOTE — Telephone Encounter (Deleted)
-----   Message from Zhou Yu, MD sent at 06/09/2020  7:54 PM EDT ----- °Please arrange him to get IV venofer weekly x 4. We discussed about the possibility today.  °Follow up plan lab in 10 weeks, 1-2 days prior. MD +/- venofer.  °Lab has ordered °

## 2020-06-13 NOTE — Telephone Encounter (Signed)
error 

## 2020-06-17 ENCOUNTER — Inpatient Hospital Stay: Payer: 59

## 2020-06-17 ENCOUNTER — Other Ambulatory Visit: Payer: Self-pay

## 2020-06-17 VITALS — BP 119/74 | HR 66 | Temp 97.9°F | Resp 18

## 2020-06-17 DIAGNOSIS — D500 Iron deficiency anemia secondary to blood loss (chronic): Secondary | ICD-10-CM

## 2020-06-17 DIAGNOSIS — Z8719 Personal history of other diseases of the digestive system: Secondary | ICD-10-CM

## 2020-06-17 DIAGNOSIS — D509 Iron deficiency anemia, unspecified: Secondary | ICD-10-CM | POA: Diagnosis not present

## 2020-06-17 MED ORDER — SODIUM CHLORIDE 0.9 % IV SOLN: 200.0000 mg | Freq: Once | INTRAVENOUS | Status: DC

## 2020-06-17 MED ORDER — IRON SUCROSE 20 MG/ML IV SOLN
200.0000 mg | Freq: Once | INTRAVENOUS | Status: AC
  Administered 2020-06-17: 200 mg via INTRAVENOUS
  Filled 2020-06-17: qty 10

## 2020-06-17 MED ORDER — SODIUM CHLORIDE 0.9 % IV SOLN
Freq: Once | INTRAVENOUS | Status: AC
  Filled 2020-06-17: qty 250

## 2020-06-24 ENCOUNTER — Ambulatory Visit: Payer: 59

## 2020-07-01 ENCOUNTER — Other Ambulatory Visit: Payer: Self-pay

## 2020-07-01 ENCOUNTER — Inpatient Hospital Stay: Payer: 59 | Attending: Oncology

## 2020-07-01 VITALS — BP 117/72 | HR 72 | Temp 96.8°F | Resp 18

## 2020-07-01 DIAGNOSIS — D500 Iron deficiency anemia secondary to blood loss (chronic): Secondary | ICD-10-CM

## 2020-07-01 DIAGNOSIS — Z8719 Personal history of other diseases of the digestive system: Secondary | ICD-10-CM

## 2020-07-01 DIAGNOSIS — D509 Iron deficiency anemia, unspecified: Secondary | ICD-10-CM | POA: Diagnosis present

## 2020-07-01 MED ORDER — IRON SUCROSE 20 MG/ML IV SOLN
200.0000 mg | Freq: Once | INTRAVENOUS | Status: AC
  Administered 2020-07-01: 200 mg via INTRAVENOUS
  Filled 2020-07-01: qty 10

## 2020-07-01 MED ORDER — SODIUM CHLORIDE 0.9 % IV SOLN: 200.0000 mg | Freq: Once | INTRAVENOUS | Status: DC

## 2020-07-01 MED ORDER — SODIUM CHLORIDE 0.9 % IV SOLN
Freq: Once | INTRAVENOUS | Status: AC
  Filled 2020-07-01: qty 250

## 2020-07-01 MED ORDER — SODIUM CHLORIDE 0.9 % IV SOLN
200.0000 mg | Freq: Once | INTRAVENOUS | Status: DC
  Filled 2020-07-01: qty 10

## 2020-07-01 NOTE — Progress Notes (Signed)
Pt tolerated infusion well. No s/s of distress or reaction noted. Pt and VS stable at discharge.  

## 2020-07-08 ENCOUNTER — Other Ambulatory Visit: Payer: Self-pay

## 2020-07-08 ENCOUNTER — Inpatient Hospital Stay: Payer: 59

## 2020-07-08 VITALS — BP 137/84 | HR 61 | Temp 96.3°F

## 2020-07-08 DIAGNOSIS — D509 Iron deficiency anemia, unspecified: Secondary | ICD-10-CM | POA: Diagnosis not present

## 2020-07-08 DIAGNOSIS — Z8719 Personal history of other diseases of the digestive system: Secondary | ICD-10-CM

## 2020-07-08 DIAGNOSIS — D500 Iron deficiency anemia secondary to blood loss (chronic): Secondary | ICD-10-CM

## 2020-07-08 MED ORDER — SODIUM CHLORIDE 0.9 % IV SOLN
Freq: Once | INTRAVENOUS | Status: AC
  Filled 2020-07-08: qty 250

## 2020-07-08 MED ORDER — SODIUM CHLORIDE 0.9 % IV SOLN: 200.0000 mg | Freq: Once | INTRAVENOUS | Status: DC

## 2020-07-08 MED ORDER — IRON SUCROSE 20 MG/ML IV SOLN
200.0000 mg | Freq: Once | INTRAVENOUS | Status: AC
  Administered 2020-07-08: 200 mg via INTRAVENOUS
  Filled 2020-07-08: qty 10

## 2020-07-15 ENCOUNTER — Inpatient Hospital Stay: Payer: 59

## 2020-07-15 ENCOUNTER — Other Ambulatory Visit: Payer: Self-pay

## 2020-07-15 VITALS — BP 126/80 | HR 72 | Temp 96.5°F | Resp 18

## 2020-07-15 DIAGNOSIS — D500 Iron deficiency anemia secondary to blood loss (chronic): Secondary | ICD-10-CM

## 2020-07-15 DIAGNOSIS — Z8719 Personal history of other diseases of the digestive system: Secondary | ICD-10-CM

## 2020-07-15 DIAGNOSIS — D509 Iron deficiency anemia, unspecified: Secondary | ICD-10-CM | POA: Diagnosis not present

## 2020-07-15 MED ORDER — SODIUM CHLORIDE 0.9 % IV SOLN: 200.0000 mg | Freq: Once | INTRAVENOUS | Status: DC

## 2020-07-15 MED ORDER — SODIUM CHLORIDE 0.9 % IV SOLN
Freq: Once | INTRAVENOUS | Status: AC
  Filled 2020-07-15: qty 250

## 2020-07-15 MED ORDER — IRON SUCROSE 20 MG/ML IV SOLN
200.0000 mg | Freq: Once | INTRAVENOUS | Status: AC
  Administered 2020-07-15: 200 mg via INTRAVENOUS
  Filled 2020-07-15: qty 10

## 2020-08-17 ENCOUNTER — Inpatient Hospital Stay: Payer: 59 | Attending: Oncology

## 2020-08-18 ENCOUNTER — Telehealth: Payer: Self-pay

## 2020-08-18 NOTE — Telephone Encounter (Signed)
Pt did not have labs drawn on 9/29. Please contact pt to see if he is willing to come in the morning for labs and keep MD/Venofer in the afternoon. Otherwise, pt will need to be rescheduled lab/MD/Venofer (labs prior)

## 2020-08-18 NOTE — Telephone Encounter (Signed)
Done... Pt said that he forgot all about his scheduled 08/17/20 lab appt  But stated that he will RTC the morning of 10/ 1 for labs Pt is was made aware that he will have to RTC again @ 130 for his scheduled 1:30 appt to see MD/+/- Venofer

## 2020-08-19 ENCOUNTER — Inpatient Hospital Stay: Payer: 59

## 2020-08-19 ENCOUNTER — Other Ambulatory Visit: Payer: Self-pay

## 2020-08-19 ENCOUNTER — Inpatient Hospital Stay: Payer: 59 | Attending: Oncology | Admitting: Oncology

## 2020-08-19 ENCOUNTER — Encounter: Payer: Self-pay | Admitting: Oncology

## 2020-08-19 VITALS — BP 131/79 | HR 72 | Temp 96.9°F | Resp 20 | Wt 275.9 lb

## 2020-08-19 DIAGNOSIS — D500 Iron deficiency anemia secondary to blood loss (chronic): Secondary | ICD-10-CM | POA: Diagnosis not present

## 2020-08-19 DIAGNOSIS — Z87891 Personal history of nicotine dependence: Secondary | ICD-10-CM | POA: Insufficient documentation

## 2020-08-19 DIAGNOSIS — Z8719 Personal history of other diseases of the digestive system: Secondary | ICD-10-CM | POA: Diagnosis not present

## 2020-08-19 DIAGNOSIS — R5383 Other fatigue: Secondary | ICD-10-CM | POA: Diagnosis not present

## 2020-08-19 DIAGNOSIS — D509 Iron deficiency anemia, unspecified: Secondary | ICD-10-CM | POA: Diagnosis not present

## 2020-08-19 LAB — RETIC PANEL
Immature Retic Fract: 20.7 % — ABNORMAL HIGH (ref 2.3–15.9)
RBC.: 4.9 MIL/uL (ref 4.22–5.81)
Retic Count, Absolute: 52 10*3/uL (ref 19.0–186.0)
Reticulocyte Hemoglobin: 30.3 pg (ref >27.9)

## 2020-08-19 LAB — CBC WITH DIFFERENTIAL/PLATELET
Abs Immature Granulocytes: 0.31 10*3/uL — ABNORMAL HIGH (ref 0.00–0.07)
Basophils Absolute: 0.1 10*3/uL (ref 0.0–0.1)
Basophils Relative: 1 %
Eosinophils Absolute: 1.6 10*3/uL — ABNORMAL HIGH (ref 0.0–0.5)
Eosinophils Relative: 13 %
HCT: 41.4 % (ref 39.0–52.0)
Hemoglobin: 13.4 g/dL (ref 13.0–17.0)
Immature Granulocytes: 3 %
Lymphocytes Relative: 19 %
Lymphs Abs: 2.4 10*3/uL (ref 0.7–4.0)
MCH: 24.8 pg — ABNORMAL LOW (ref 26.0–34.0)
MCHC: 32.4 g/dL (ref 30.0–36.0)
MCV: 76.7 fL — ABNORMAL LOW (ref 80.0–100.0)
Monocytes Absolute: 0.9 10*3/uL (ref 0.1–1.0)
Monocytes Relative: 8 %
Neutro Abs: 7.2 10*3/uL (ref 1.7–7.7)
Neutrophils Relative %: 56 %
Platelets: 228 10*3/uL (ref 150–400)
RBC: 5.4 MIL/uL (ref 4.22–5.81)
Smear Review: NORMAL
WBC: 12.5 10*3/uL — ABNORMAL HIGH (ref 4.0–10.5)
nRBC: 0 % (ref 0.0–0.2)

## 2020-08-19 LAB — IRON AND TIBC
Iron: 51 ug/dL (ref 45–182)
Saturation Ratios: 13 % — ABNORMAL LOW (ref 17.9–39.5)
TIBC: 382 ug/dL (ref 250–450)
UIBC: 331 ug/dL

## 2020-08-19 LAB — FERRITIN: Ferritin: 45 ng/mL (ref 24–336)

## 2020-08-19 NOTE — Progress Notes (Signed)
Patient's energy level is declining.

## 2020-08-19 NOTE — Progress Notes (Signed)
Hematology/Oncology Consult note [facility] Telephone:([phone] [phone]   Patient Care Team: Patrice ParadiseMcLaughlin, Miriam K, MD as PCP - General (Physician Assistant) Rickard PatienceYu, Nicey Krah, MD as Consulting Physician (Oncology)  REFERRING PROVIDER: Patrice ParadiseMcLaughlin, Miriam K, MD Follow up of  anemia  HISTORY OF PRESENTING ILLNESS:  Ronald Sparks is a  44 y.o.  male with PMH listed below was seen in consultation at the request of Patrice ParadiseMcLaughlin, Miriam K, MD   for evaluation of iron deficiency anemia.   Reviewed patient's recent labs  7/21 2021labs revealed anemia with hemoglobin of   7.9, MCV 66.1. Reviewed patient's previous labs ordered by primary care physician's office, anemia is chronic onset , duration is since April 2021.   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  Nonbleeding duodenal ulcer with no stigmata of bleeding.  Patient was treated for H. pylori.  Associated signs and symptoms: Patient reports fatigue.  Denies SOB with exertion.  Denies weight loss, easy bruising, hematochezia, hemoptysis, hematuria. Context:  History of iron deficiency: Patient has been suggested by primary care provider recently to start iron tablet twice daily and pantoprazole 40 mg twice daily. Rectal bleeding: Denies any black or bloody stool Hematemesis or hemoptysis : denies Blood in urine : denies    INTERVAL HISTORY Ronald RuizMohammad Wittler is a 44 y.o. male who has above history reviewed by me today presents for follow up visit for management of IDA Problems and complaints are listed below: Patient received IV venofer treatments and he felt improvement of his energy level. Recently he has gained weight and feels energy level has decreased.    Review of Systems  Constitutional: Positive for fatigue. Negative for appetite change, chills, fever and unexpected  weight change.  HENT:   Negative for hearing loss and voice change.   Eyes: Negative for eye problems and icterus.  Respiratory: Negative for chest tightness, cough and shortness of breath.   Cardiovascular: Negative for chest pain and leg swelling.  Gastrointestinal: Negative for abdominal distention and abdominal pain.  Endocrine: Negative for hot flashes.  Genitourinary: Negative for difficulty urinating, dysuria and frequency.   Musculoskeletal: Negative for arthralgias.  Skin: Negative for itching and rash.  Neurological: Negative for light-headedness and numbness.  Hematological: Negative for adenopathy. Does not bruise/bleed easily.  Psychiatric/Behavioral: Negative for confusion.    MEDICAL HISTORY:  Past Medical History:  Diagnosis Date  . Anxiety   . Diabetes mellitus without complication (HCC)   . Diabetes mellitus, type II (HCC)   . Forearm pain     SURGICAL HISTORY: Past Surgical History:  Procedure Laterality Date  . ESOPHAGOGASTRODUODENOSCOPY (EGD) WITH PROPOFOL N/A 03/14/2020   Procedure: ESOPHAGOGASTRODUODENOSCOPY (EGD) WITH PROPOFOL;  Surgeon: Toledo, Boykin Nearingeodoro K, MD;  Location: [facility] ENDOSCOPY;  Service: Gastroenterology;  Laterality: N/A;    SOCIAL HISTORY: Social History   Socioeconomic History  . Marital status: Married    Spouse name: jasmine  . Number of children: 3  . Years of education: Not on file  . Highest education level: Some college, no degree  Occupational History  . Not on file  Tobacco Use  . Smoking status: Former Smoker    Packs/day: 1.00    Years: 10.00    Pack years: 10.00    Types: Cigarettes    Quit date: 08/06/2007    Years since quitting: 13.0  . Smokeless tobacco: Former NeurosurgeonUser    Types: Sports administratorChew  Quit date: 08/05/2016  Vaping Use  . Vaping Use: Never used  Substance and Sexual Activity  . Alcohol use: Yes    Alcohol/week: 3.0 - 6.0 standard drinks    Types: 1 Cans of beer, 1 - 2 Shots of liquor, 1 - 3 Standard drinks or  equivalent per week    Comment: occasional   . Drug use: No  . Sexual activity: Yes    Birth control/protection: I.U.D.  Other Topics Concern  . Not on file  Social History Narrative  . Not on file   Social Determinants of Health   Financial Resource Strain:   . Difficulty of Paying Living Expenses: Not on file  Food Insecurity:   . Worried About Programme researcher, broadcasting/film/video in the Last Year: Not on file  . Ran Out of Food in the Last Year: Not on file  Transportation Needs:   . Lack of Transportation (Medical): Not on file  . Lack of Transportation (Non-Medical): Not on file  Physical Activity:   . Days of Exercise per Week: Not on file  . Minutes of Exercise per Session: Not on file  Stress:   . Feeling of Stress : Not on file  Social Connections:   . Frequency of Communication with Friends and Family: Not on file  . Frequency of Social Gatherings with Friends and Family: Not on file  . Attends Religious Services: Not on file  . Active Member of Clubs or Organizations: Not on file  . Attends Banker Meetings: Not on file  . Marital Status: Not on file  Intimate Partner Violence:   . Fear of Current or Ex-Partner: Not on file  . Emotionally Abused: Not on file  . Physically Abused: Not on file  . Sexually Abused: Not on file    FAMILY HISTORY: Family History  Problem Relation Age of Onset  . Diabetes Mother   . Hypertension Mother   . Sleep apnea Mother   . Dementia Father   . Atrial fibrillation Father   . Diabetes Father     ALLERGIES:  is allergic to aspirin.  MEDICATIONS:  Current Outpatient Medications  Medication Sig Dispense Refill  . ferrous sulfate 325 (65 FE) MG EC tablet Take by mouth.     Marland Kitchen glucose blood test strip Use 2 (two) times daily Use as instructed. BAYER CONTOUR NEXT TEST STRIPS E11.9    . Ibuprofen 200 MG CAPS Take by mouth.    . pantoprazole (PROTONIX) 40 MG tablet Take 40 mg by mouth daily.    . sitaGLIPtin (JANUVIA) 100 MG  tablet Take by mouth.    . valACYclovir (VALTREX) 1000 MG tablet Take 1,000 mg by mouth 2 (two) times daily.     No current facility-administered medications for this visit.     PHYSICAL EXAMINATION: ECOG PERFORMANCE STATUS: 1 - Symptomatic but completely ambulatory Vitals:   08/19/20 1343  BP: 131/79  Pulse: 72  Resp: 20  Temp: (!) 96.9 F (36.1 C)   Filed Weights   08/19/20 1343  Weight: 275 lb 14.4 oz (125.1 kg)    Physical Exam Constitutional:      General: He is not in acute distress. HENT:     Head: Normocephalic and atraumatic.  Eyes:     General: No scleral icterus. Cardiovascular:     Rate and Rhythm: Normal rate and regular rhythm.     Heart sounds: Normal heart sounds.  Pulmonary:     Effort: Pulmonary effort is normal. No  respiratory distress.     Breath sounds: No wheezing.  Abdominal:     General: Bowel sounds are normal. There is no distension.     Palpations: Abdomen is soft.  Musculoskeletal:        General: No deformity. Normal range of motion.     Cervical back: Normal range of motion and neck supple.  Skin:    General: Skin is warm and dry.     Coloration: Skin is pale.     Findings: No erythema or rash.  Neurological:     Mental Status: He is alert and oriented to person, place, and time. Mental status is at baseline.     Cranial Nerves: No cranial nerve deficit.     Coordination: Coordination normal.  Psychiatric:        Mood and Affect: Mood normal.       CMP Latest Ref Rng & Units 11/12/2016  Glucose 65 - 99 mg/dL 681(L)  BUN 6 - 20 mg/dL 16  Creatinine 5.72 - 6.20 mg/dL 3.55  Sodium 974 - 163 mmol/L 133(L)  Potassium 3.5 - 5.1 mmol/L 4.0  Chloride 101 - 111 mmol/L 101  CO2 22 - 32 mmol/L 24  Calcium 8.9 - 10.3 mg/dL 9.1   CBC Latest Ref Rng & Units 08/19/2020  WBC 4.0 - 10.5 K/uL 12.5(H)  Hemoglobin 13.0 - 17.0 g/dL 84.5  Hematocrit 39 - 52 % 41.4  Platelets 150 - 400 K/uL 228     LABORATORY DATA:  I have reviewed the  data as listed Lab Results  Component Value Date   WBC 12.5 (H) 08/19/2020   HGB 13.4 08/19/2020   HCT 41.4 08/19/2020   MCV 76.7 (L) 08/19/2020   PLT 228 08/19/2020   No results for input(s): NA, K, CL, CO2, GLUCOSE, BUN, CREATININE, CALCIUM, GFRNONAA, GFRAA, PROT, ALBUMIN, AST, ALT, ALKPHOS, BILITOT, BILIDIR, IBILI in the last 8760 hours. Iron/TIBC/Ferritin/ %Sat    Component Value Date/Time   IRON 51 08/19/2020 1020   TIBC 382 08/19/2020 1020   FERRITIN 45 08/19/2020 1020   IRONPCTSAT 13 (L) 08/19/2020 1020     RADIOGRAPHIC STUDIES: I have personally reviewed the radiological images as listed and agreed with the findings in the report. No results found.     ASSESSMENT & PLAN:  1. History of gastritis   2. Iron deficiency anemia due to chronic blood loss    Iron deficiency anemia,  He had iron panel done in the beginning of September, and iron store has improved , iron saturation 33, ferritin 100.  08/19/2020 ferritin 45, iron saturation 13, TIBC 382.  Patient prefers not to take long term oral iron supplementation.  I recommend to repeat blood work in 6 weeks to ensure stability.   History of gastritis, symptoms have resolved.    Orders Placed This Encounter  Procedures  . CBC with Differential/Platelet    Standing Status:   Future    Standing Expiration Date:   08/19/2021  . Ferritin    Standing Status:   Future    Standing Expiration Date:   08/19/2021  . Iron and TIBC    Standing Status:   Future    Standing Expiration Date:   08/19/2021    All questions were answered. The patient knows to call the clinic with any problems questions or concerns. Thank you for this kind referral and the opportunity to participate in the care of this patient. A copy of today's note is routed to referring provider  Rickard Patience, MD, PhD Hematology Oncology [facility] 08/19/2020

## 2020-09-28 ENCOUNTER — Other Ambulatory Visit: Payer: Self-pay

## 2020-09-28 ENCOUNTER — Telehealth: Payer: Self-pay

## 2020-09-28 ENCOUNTER — Inpatient Hospital Stay: Payer: 59

## 2020-09-28 ENCOUNTER — Inpatient Hospital Stay: Payer: 59 | Admitting: Oncology

## 2020-09-28 NOTE — Telephone Encounter (Signed)
Done... Pts 09/28/20 NS am lab/ MyChart afternoon/11/11+/- Venofer appts has been R/S as requested. I was unable to reach pt by phone. A reminder letter will be mailed out making him aware of him upcoming appt date and time

## 2020-09-28 NOTE — Telephone Encounter (Addendum)
Pt did not come for iron labs this morning. Please reschedule lab/Mychart/ venofer (labs 1 days prior) and call pt with new appts.

## 2020-09-29 ENCOUNTER — Inpatient Hospital Stay: Payer: 59

## 2020-10-07 ENCOUNTER — Encounter: Payer: Self-pay | Admitting: Oncology

## 2020-10-07 ENCOUNTER — Inpatient Hospital Stay: Payer: 59 | Attending: Oncology

## 2020-10-07 ENCOUNTER — Inpatient Hospital Stay (HOSPITAL_BASED_OUTPATIENT_CLINIC_OR_DEPARTMENT_OTHER): Payer: 59 | Admitting: Oncology

## 2020-10-07 ENCOUNTER — Other Ambulatory Visit: Payer: Self-pay

## 2020-10-07 VITALS — BP 134/89 | HR 62 | Temp 97.8°F | Resp 18 | Wt 280.6 lb

## 2020-10-07 DIAGNOSIS — Z8719 Personal history of other diseases of the digestive system: Secondary | ICD-10-CM

## 2020-10-07 DIAGNOSIS — D500 Iron deficiency anemia secondary to blood loss (chronic): Secondary | ICD-10-CM | POA: Diagnosis not present

## 2020-10-07 DIAGNOSIS — Z87891 Personal history of nicotine dependence: Secondary | ICD-10-CM | POA: Insufficient documentation

## 2020-10-07 LAB — CBC WITH DIFFERENTIAL/PLATELET
Abs Immature Granulocytes: 0.19 10*3/uL — ABNORMAL HIGH (ref 0.00–0.07)
Basophils Absolute: 0.1 10*3/uL (ref 0.0–0.1)
Basophils Relative: 1 %
Eosinophils Absolute: 1.1 10*3/uL — ABNORMAL HIGH (ref 0.0–0.5)
Eosinophils Relative: 9 %
HCT: 42.8 % (ref 39.0–52.0)
Hemoglobin: 13.9 g/dL (ref 13.0–17.0)
Immature Granulocytes: 2 %
Lymphocytes Relative: 28 %
Lymphs Abs: 3.3 10*3/uL (ref 0.7–4.0)
MCH: 26.8 pg (ref 26.0–34.0)
MCHC: 32.5 g/dL (ref 30.0–36.0)
MCV: 82.5 fL (ref 80.0–100.0)
Monocytes Absolute: 0.8 10*3/uL (ref 0.1–1.0)
Monocytes Relative: 7 %
Neutro Abs: 6.4 10*3/uL (ref 1.7–7.7)
Neutrophils Relative %: 53 %
Platelets: 221 10*3/uL (ref 150–400)
RBC: 5.19 MIL/uL (ref 4.22–5.81)
RDW: 15.1 % (ref 11.5–15.5)
WBC: 11.8 10*3/uL — ABNORMAL HIGH (ref 4.0–10.5)
nRBC: 0 % (ref 0.0–0.2)

## 2020-10-07 LAB — FERRITIN: Ferritin: 14 ng/mL — ABNORMAL LOW (ref 24–336)

## 2020-10-07 LAB — IRON AND TIBC
Iron: 49 ug/dL (ref 45–182)
Saturation Ratios: 10 % — ABNORMAL LOW (ref 17.9–39.5)
TIBC: 497 ug/dL — ABNORMAL HIGH (ref 250–450)
UIBC: 448 ug/dL

## 2020-10-07 NOTE — Progress Notes (Signed)
Hematology/Oncology Consult note [facility] Telephone:([phone] [phone]   Patient Care Team: Patrice Paradise, MD as PCP - General (Physician Assistant) Rickard Patience, MD as Consulting Physician (Oncology)  REFERRING PROVIDER: Patrice Paradise, MD Follow up of  anemia  HISTORY OF PRESENTING ILLNESS:  Ronald Sparks is a  44 y.o.  male with PMH listed below was seen in consultation at the request of Patrice Paradise, MD   for evaluation of iron deficiency anemia.   Reviewed patient's recent labs  7/21 2021labs revealed anemia with hemoglobin of   7.9, MCV 66.1. Reviewed patient's previous labs ordered by primary care physician's office, anemia is chronic onset , duration is since April 2021.   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  Nonbleeding duodenal ulcer with no stigmata of bleeding.  Patient was treated for H. pylori.  Associated signs and symptoms: Patient reports fatigue.  Denies SOB with exertion.  Denies weight loss, easy bruising, hematochezia, hemoptysis, hematuria. Context:  History of iron deficiency: Patient has been suggested by primary care provider recently to start iron tablet twice daily and pantoprazole 40 mg twice daily. Rectal bleeding: Denies any black or bloody stool Hematemesis or hemoptysis : denies Blood in urine : denies    INTERVAL HISTORY Ronald Sparks is a 44 y.o. male who has above history reviewed by me today presents for follow up visit for management of IDA Problems and complaints are listed below: He is off iron supplementation.  Denies any stomach discomfort. He take PPI PRN.  He drinks alcohol on weekends.   Review of Systems  Constitutional: Negative for appetite change, chills, fatigue, fever and unexpected weight change.  HENT:   Negative for hearing loss  and voice change.   Eyes: Negative for eye problems and icterus.  Respiratory: Negative for chest tightness, cough and shortness of breath.   Cardiovascular: Negative for chest pain and leg swelling.  Gastrointestinal: Negative for abdominal distention and abdominal pain.  Endocrine: Negative for hot flashes.  Genitourinary: Negative for difficulty urinating, dysuria and frequency.   Musculoskeletal: Negative for arthralgias.  Skin: Negative for itching and rash.  Neurological: Negative for light-headedness and numbness.  Hematological: Negative for adenopathy. Does not bruise/bleed easily.  Psychiatric/Behavioral: Negative for confusion.    MEDICAL HISTORY:  Past Medical History:  Diagnosis Date  . Anxiety   . Diabetes mellitus without complication (HCC)   . Diabetes mellitus, type II (HCC)   . Forearm pain     SURGICAL HISTORY: Past Surgical History:  Procedure Laterality Date  . ESOPHAGOGASTRODUODENOSCOPY (EGD) WITH PROPOFOL N/A 03/14/2020   Procedure: ESOPHAGOGASTRODUODENOSCOPY (EGD) WITH PROPOFOL;  Surgeon: Toledo, Boykin Nearing, MD;  Location: [facility] ENDOSCOPY;  Service: Gastroenterology;  Laterality: N/A;    SOCIAL HISTORY: Social History   Socioeconomic History  . Marital status: Married    Spouse name: jasmine  . Number of children: 3  . Years of education: Not on file  . Highest education level: Some college, no degree  Occupational History  . Not on file  Tobacco Use  . Smoking status: Former Smoker    Packs/day: 1.00    Years: 10.00    Pack years: 10.00    Types: Cigarettes    Quit date: 08/06/2007    Years since quitting: 13.1  . Smokeless tobacco: Former Neurosurgeon    Types: Chew    Quit date: 08/05/2016  Vaping Use  .  Vaping Use: Never used  Substance and Sexual Activity  . Alcohol use: Yes    Alcohol/week: 3.0 - 6.0 standard drinks    Types: 1 Cans of beer, 1 - 2 Shots of liquor, 1 - 3 Standard drinks or equivalent per week    Comment: occasional   . Drug  use: No  . Sexual activity: Yes    Birth control/protection: I.U.D.  Other Topics Concern  . Not on file  Social History Narrative  . Not on file   Social Determinants of Health   Financial Resource Strain:   . Difficulty of Paying Living Expenses: Not on file  Food Insecurity:   . Worried About Programme researcher, broadcasting/film/video in the Last Year: Not on file  . Ran Out of Food in the Last Year: Not on file  Transportation Needs:   . Lack of Transportation (Medical): Not on file  . Lack of Transportation (Non-Medical): Not on file  Physical Activity:   . Days of Exercise per Week: Not on file  . Minutes of Exercise per Session: Not on file  Stress:   . Feeling of Stress : Not on file  Social Connections:   . Frequency of Communication with Friends and Family: Not on file  . Frequency of Social Gatherings with Friends and Family: Not on file  . Attends Religious Services: Not on file  . Active Member of Clubs or Organizations: Not on file  . Attends Banker Meetings: Not on file  . Marital Status: Not on file  Intimate Partner Violence:   . Fear of Current or Ex-Partner: Not on file  . Emotionally Abused: Not on file  . Physically Abused: Not on file  . Sexually Abused: Not on file    FAMILY HISTORY: Family History  Problem Relation Age of Onset  . Diabetes Mother   . Hypertension Mother   . Sleep apnea Mother   . Dementia Father   . Atrial fibrillation Father   . Diabetes Father     ALLERGIES:  is allergic to aspirin.  MEDICATIONS:  Current Outpatient Medications  Medication Sig Dispense Refill  . pantoprazole (PROTONIX) 40 MG tablet Take 40 mg by mouth daily.    . pioglitazone (ACTOS) 30 MG tablet Take 30 mg by mouth daily.    . sitaGLIPtin (JANUVIA) 100 MG tablet Take by mouth.    . valACYclovir (VALTREX) 1000 MG tablet Take 1,000 mg by mouth 2 (two) times daily.    . ferrous sulfate 325 (65 FE) MG EC tablet Take by mouth.  (Patient not taking: Reported on  10/07/2020)    . glucose blood test strip Use 2 (two) times daily Use as instructed. BAYER CONTOUR NEXT TEST STRIPS E11.9 (Patient not taking: Reported on 10/07/2020)    . Ibuprofen 200 MG CAPS Take by mouth. (Patient not taking: Reported on 10/07/2020)     No current facility-administered medications for this visit.     PHYSICAL EXAMINATION: ECOG PERFORMANCE STATUS: 1 - Symptomatic but completely ambulatory Vitals:   10/07/20 1416  BP: 134/89  Pulse: 62  Resp: 18  Temp: 97.8 F (36.6 C)   Filed Weights   10/07/20 1416  Weight: 280 lb 9.6 oz (127.3 kg)    Physical Exam Constitutional:      General: He is not in acute distress. HENT:     Head: Normocephalic and atraumatic.  Eyes:     General: No scleral icterus. Cardiovascular:     Rate and Rhythm: Normal rate  and regular rhythm.     Heart sounds: Normal heart sounds.  Pulmonary:     Effort: Pulmonary effort is normal. No respiratory distress.     Breath sounds: No wheezing.  Abdominal:     General: Bowel sounds are normal. There is no distension.     Palpations: Abdomen is soft.  Musculoskeletal:        General: No deformity. Normal range of motion.     Cervical back: Normal range of motion and neck supple.  Skin:    General: Skin is warm and dry.     Coloration: Skin is not pale.     Findings: No erythema or rash.  Neurological:     Mental Status: He is alert and oriented to person, place, and time. Mental status is at baseline.     Cranial Nerves: No cranial nerve deficit.     Coordination: Coordination normal.  Psychiatric:        Mood and Affect: Mood normal.       CMP Latest Ref Rng & Units 11/12/2016  Glucose 65 - 99 mg/dL 462(M)  BUN 6 - 20 mg/dL 16  Creatinine 6.38 - 1.77 mg/dL 1.16  Sodium 579 - 038 mmol/L 133(L)  Potassium 3.5 - 5.1 mmol/L 4.0  Chloride 101 - 111 mmol/L 101  CO2 22 - 32 mmol/L 24  Calcium 8.9 - 10.3 mg/dL 9.1   CBC Latest Ref Rng & Units 10/07/2020  WBC 4.0 - 10.5 K/uL  11.8(H)  Hemoglobin 13.0 - 17.0 g/dL 33.3  Hematocrit 39 - 52 % 42.8  Platelets 150 - 400 K/uL 221     LABORATORY DATA:  I have reviewed the data as listed Lab Results  Component Value Date   WBC 11.8 (H) 10/07/2020   HGB 13.9 10/07/2020   HCT 42.8 10/07/2020   MCV 82.5 10/07/2020   PLT 221 10/07/2020   No results for input(s): NA, K, CL, CO2, GLUCOSE, BUN, CREATININE, CALCIUM, GFRNONAA, GFRAA, PROT, ALBUMIN, AST, ALT, ALKPHOS, BILITOT, BILIDIR, IBILI in the last 8760 hours. Iron/TIBC/Ferritin/ %Sat    Component Value Date/Time   IRON 49 10/07/2020 1200   TIBC 497 (H) 10/07/2020 1200   FERRITIN 14 (L) 10/07/2020 1200   IRONPCTSAT 10 (L) 10/07/2020 1200     RADIOGRAPHIC STUDIES: I have personally reviewed the radiological images as listed and agreed with the findings in the report. No results found.     ASSESSMENT & PLAN:  1. Iron deficiency anemia due to chronic blood loss   2. History of gastritis    Iron deficiency anemia,  Off iron supplementation since last visit.  Labs are reviewed and discussed with patient. Hemoglobin is stable. Iron store has decreased after he stopped oral iron supplementation.  I recommend him to resume oral iron supplementation.   Gastritis, alcohol cessation was discussed. Continue PPI. I encourage him to follow up with GI.   Orders Placed This Encounter  Procedures  . CBC with Differential/Platelet    Standing Status:   Future    Standing Expiration Date:   10/07/2021  . Ferritin    Standing Status:   Future    Standing Expiration Date:   10/07/2021  . Iron and TIBC    Standing Status:   Future    Standing Expiration Date:   10/07/2021    All questions were answered. The patient knows to call the clinic with any problems questions or concerns.   Rickard Patience, MD, PhD Hematology Oncology [facility] at  [facility] 10/07/2020

## 2020-10-07 NOTE — Progress Notes (Signed)
Pt here for follow up. No new concerns voiced.   

## 2020-10-21 ENCOUNTER — Other Ambulatory Visit: Payer: 59

## 2020-10-21 ENCOUNTER — Telehealth: Payer: 59 | Admitting: Oncology

## 2020-10-24 ENCOUNTER — Ambulatory Visit: Payer: 59

## 2020-11-11 ENCOUNTER — Other Ambulatory Visit
Admission: RE | Admit: 2020-11-11 | Discharge: 2020-11-11 | Disposition: A | Discharge status: Home / Self Care | Payer: 59 | Source: Ambulatory Visit | Attending: Gastroenterology | Admitting: Gastroenterology

## 2020-11-11 ENCOUNTER — Other Ambulatory Visit: Payer: Self-pay

## 2020-11-11 DIAGNOSIS — Z01812 Encounter for preprocedural laboratory examination: Secondary | ICD-10-CM | POA: Insufficient documentation

## 2020-11-11 DIAGNOSIS — Z20822 Contact with and (suspected) exposure to covid-19: Secondary | ICD-10-CM | POA: Diagnosis not present

## 2020-11-12 LAB — SARS CORONAVIRUS 2 (TAT 6-24 HRS): SARS Coronavirus 2: NEGATIVE

## 2020-11-14 ENCOUNTER — Encounter: Payer: Self-pay | Admitting: *Deleted

## 2020-11-15 ENCOUNTER — Ambulatory Visit: Payer: 59 | Admitting: Anesthesiology

## 2020-11-15 ENCOUNTER — Encounter
Admission: RE | Disposition: A | Discharge status: Home / Self Care | Payer: Self-pay | Source: Home / Self Care | Attending: Gastroenterology

## 2020-11-15 ENCOUNTER — Ambulatory Visit
Admission: RE | Admit: 2020-11-15 | Discharge: 2020-11-15 | Disposition: A | Discharge status: Home / Self Care | Payer: 59 | Attending: Gastroenterology | Admitting: Gastroenterology

## 2020-11-15 ENCOUNTER — Encounter: Payer: Self-pay | Admitting: *Deleted

## 2020-11-15 ENCOUNTER — Other Ambulatory Visit: Payer: Self-pay

## 2020-11-15 DIAGNOSIS — Z8711 Personal history of peptic ulcer disease: Secondary | ICD-10-CM | POA: Diagnosis not present

## 2020-11-15 DIAGNOSIS — Z886 Allergy status to analgesic agent status: Secondary | ICD-10-CM | POA: Diagnosis not present

## 2020-11-15 DIAGNOSIS — K6289 Other specified diseases of anus and rectum: Secondary | ICD-10-CM | POA: Insufficient documentation

## 2020-11-15 DIAGNOSIS — Z79899 Other long term (current) drug therapy: Secondary | ICD-10-CM | POA: Insufficient documentation

## 2020-11-15 DIAGNOSIS — D509 Iron deficiency anemia, unspecified: Secondary | ICD-10-CM | POA: Diagnosis not present

## 2020-11-15 DIAGNOSIS — Z7984 Long term (current) use of oral hypoglycemic drugs: Secondary | ICD-10-CM | POA: Insufficient documentation

## 2020-11-15 DIAGNOSIS — E119 Type 2 diabetes mellitus without complications: Secondary | ICD-10-CM | POA: Diagnosis not present

## 2020-11-15 DIAGNOSIS — Z791 Long term (current) use of non-steroidal anti-inflammatories (NSAID): Secondary | ICD-10-CM | POA: Insufficient documentation

## 2020-11-15 DIAGNOSIS — K529 Noninfective gastroenteritis and colitis, unspecified: Secondary | ICD-10-CM | POA: Diagnosis not present

## 2020-11-15 DIAGNOSIS — K2980 Duodenitis without bleeding: Secondary | ICD-10-CM | POA: Insufficient documentation

## 2020-11-15 DIAGNOSIS — K2970 Gastritis, unspecified, without bleeding: Secondary | ICD-10-CM | POA: Insufficient documentation

## 2020-11-15 DIAGNOSIS — K640 First degree hemorrhoids: Secondary | ICD-10-CM | POA: Diagnosis not present

## 2020-11-15 HISTORY — PX: COLONOSCOPY WITH PROPOFOL: SHX5780

## 2020-11-15 HISTORY — DX: Mild intermittent asthma, uncomplicated: J45.20

## 2020-11-15 HISTORY — DX: Gastro-esophageal reflux disease without esophagitis: K21.9

## 2020-11-15 HISTORY — PX: ESOPHAGOGASTRODUODENOSCOPY (EGD) WITH PROPOFOL: SHX5813

## 2020-11-15 HISTORY — DX: Personal history of other diseases of the digestive system: Z87.19

## 2020-11-15 LAB — GLUCOSE, CAPILLARY: Glucose-Capillary: 96 mg/dL (ref 70–99)

## 2020-11-15 SURGERY — ESOPHAGOGASTRODUODENOSCOPY (EGD) WITH PROPOFOL
Anesthesia: General

## 2020-11-15 MED ORDER — PROPOFOL 10 MG/ML IV BOLUS
INTRAVENOUS | Status: DC | PRN
  Administered 2020-11-15: 50 mg via INTRAVENOUS
  Administered 2020-11-15: 30 mg via INTRAVENOUS
  Administered 2020-11-15: 20 mg via INTRAVENOUS

## 2020-11-15 MED ORDER — MIDAZOLAM HCL 2 MG/2ML IJ SOLN
INTRAMUSCULAR | Status: DC | PRN
  Administered 2020-11-15: 1 mg via INTRAVENOUS

## 2020-11-15 MED ORDER — PROPOFOL 500 MG/50ML IV EMUL
INTRAVENOUS | Status: AC
  Filled 2020-11-15: qty 50

## 2020-11-15 MED ORDER — MIDAZOLAM HCL 2 MG/2ML IJ SOLN
INTRAMUSCULAR | Status: AC
  Filled 2020-11-15: qty 2

## 2020-11-15 MED ORDER — PROPOFOL 10 MG/ML IV BOLUS
INTRAVENOUS | Status: AC
  Filled 2020-11-15: qty 20

## 2020-11-15 MED ORDER — PROPOFOL 500 MG/50ML IV EMUL
INTRAVENOUS | Status: DC | PRN
  Administered 2020-11-15: 180 ug/kg/min via INTRAVENOUS

## 2020-11-15 MED ORDER — LIDOCAINE HCL (CARDIAC) PF 100 MG/5ML IV SOSY
PREFILLED_SYRINGE | INTRAVENOUS | Status: DC | PRN
  Administered 2020-11-15: 80 mg via INTRAVENOUS

## 2020-11-15 MED ORDER — SODIUM CHLORIDE 0.9 % IV SOLN: INTRAVENOUS | Status: DC

## 2020-11-15 NOTE — Interval H&P Note (Signed)
History and Physical Interval Note:  11/15/2020 10:34 AM  Ronald Sparks  has presented today for surgery, with the diagnosis of ANEMIA.  The various methods of treatment have been discussed with the patient and family. After consideration of risks, benefits and other options for treatment, the patient has consented to  Procedure(s): ESOPHAGOGASTRODUODENOSCOPY (EGD) WITH PROPOFOL (N/A) COLONOSCOPY WITH PROPOFOL (N/A) as a surgical intervention.  The patient's history has been reviewed, patient examined, no change in status, stable for surgery.  I have reviewed the patient's chart and labs.  Questions were answered to the patient's satisfaction.     Regis Bill  Ok to proceed with EGD/Colonoscopy

## 2020-11-15 NOTE — Transfer of Care (Signed)
Immediate Anesthesia Transfer of Care Note  Patient: Ronald Sparks  Procedure(s) Performed: ESOPHAGOGASTRODUODENOSCOPY (EGD) WITH PROPOFOL (N/A ) COLONOSCOPY WITH PROPOFOL (N/A )  Patient Location: PACU  Anesthesia Type:General  Level of Consciousness: awake, alert  and oriented  Airway & Oxygen Therapy: Patient Spontanous Breathing and Patient connected to nasal cannula oxygen  Post-op Assessment: Report given to RN and Post -op Vital signs reviewed and stable  Post vital signs: Reviewed and stable  Last Vitals:  Vitals Value Taken Time  BP 108/74 11/15/20 1131  Temp 36.1 C 11/15/20 1131  Pulse 82 11/15/20 1131  Resp 13 11/15/20 1131  SpO2 95 % 11/15/20 1131    Last Pain:  Vitals:   11/15/20 1131  TempSrc: Temporal  PainSc: Asleep         Complications: No complications documented.
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
## 2020-11-15 NOTE — Anesthesia Preprocedure Evaluation (Signed)
Anesthesia Evaluation  Patient identified by MRN, date of birth, ID band Patient awake    Reviewed: Allergy & Precautions, NPO status , Patient's Chart, lab work & pertinent test results  History of Anesthesia Complications Negative for: history of anesthetic complications  Airway Mallampati: III       Dental   Pulmonary asthma , neg sleep apnea, neg COPD, Not current smoker, former smoker,           Cardiovascular (-) hypertension(-) Past MI and (-) CHF (-) dysrhythmias (-) Valvular Problems/Murmurs     Neuro/Psych neg Seizures Anxiety    GI/Hepatic Neg liver ROS, GERD  Medicated and Controlled,  Endo/Other  diabetes, Type 2, Oral Hypoglycemic AgentsMorbid obesity  Renal/GU negative Renal ROS     Musculoskeletal   Abdominal   Peds  Hematology   Anesthesia Other Findings   Reproductive/Obstetrics                             Anesthesia Physical Anesthesia Plan  ASA: III  Anesthesia Plan: General   Post-op Pain Management:    Induction: Intravenous  PONV Risk Score and Plan: 2 and Propofol infusion and TIVA  Airway Management Planned: Nasal Cannula  Additional Equipment:   Intra-op Plan:   Post-operative Plan:   Informed Consent: I have reviewed the patients History and Physical, chart, labs and discussed the procedure including the risks, benefits and alternatives for the proposed anesthesia with the patient or authorized representative who has indicated his/her understanding and acceptance.       Plan Discussed with:   Anesthesia Plan Comments:         Anesthesia Quick Evaluation

## 2020-11-15 NOTE — Anesthesia Procedure Notes (Signed)
Date/Time: 11/15/2020 11:03 AM Performed by: Henrietta Hoover, CRNA Pre-anesthesia Checklist: Patient identified, Emergency Drugs available, Suction available, Patient being monitored and Timeout performed Patient Re-evaluated:Patient Re-evaluated prior to induction Oxygen Delivery Method: Nasal cannula Placement Confirmation: positive ETCO2

## 2020-11-15 NOTE — Op Note (Signed)
[facility] Patient Name: Ronald Sparks Procedure Date: 11/15/2020 10:33 AM MRN: [id number] Account #: [id number] Date of Birth: 1976-01-01 Admit Type: Outpatient Age: 44 Room: [facility] ENDO ROOM 1 Gender: Male Note Status: Finalized Procedure:             Upper GI endoscopy Indications:           Iron deficiency anemia Providers:             Andrey Farmer MD, MD Medicines:             Monitored Anesthesia Care Complications:         No immediate complications. Estimated blood loss:                         Minimal. Procedure:             Pre-Anesthesia Assessment:                        - Prior to the procedure, a History and Physical was                         performed, and patient medications and allergies were                         reviewed. The patient is competent. The risks and                         benefits of the procedure and the sedation options and                         risks were discussed with the patient. All questions                         were answered and informed consent was obtained.                         Patient identification and proposed procedure were                         verified by the physician, the nurse, the anesthetist                         and the technician in the endoscopy suite. Mental                         Status Examination: alert and oriented. Airway                         Examination: normal oropharyngeal airway and neck                         mobility. Respiratory Examination: clear to                         auscultation. CV Examination: normal. Prophylactic                         Antibiotics: The patient does not require prophylactic  antibiotics. Prior Anticoagulants: The patient has                         taken no previous anticoagulant or antiplatelet                         agents. ASA Grade Assessment: II - A patient with mild                          systemic disease. After reviewing the risks and                         benefits, the patient was deemed in satisfactory                         condition to undergo the procedure. The anesthesia                         plan was to use monitored anesthesia care (MAC).                         Immediately prior to administration of medications,                         the patient was re-assessed for adequacy to receive                         sedatives. The heart rate, respiratory rate, oxygen                         saturations, blood pressure, adequacy of pulmonary                         ventilation, and response to care were monitored                         throughout the procedure. The physical status of the                         patient was re-assessed after the procedure.                        After obtaining informed consent, the endoscope was                         passed under direct vision. Throughout the procedure,                         the patient's blood pressure, pulse, and oxygen                         saturations were monitored continuously. The Endoscope                         was introduced through the mouth, and advanced to the                         second part of duodenum. The upper GI endoscopy was  accomplished without difficulty. The patient tolerated                         the procedure well. Findings:      The examined esophagus was normal.      Localized mildly erythematous mucosa without bleeding was found in the       gastric antrum. Biopsies were taken with a cold forceps for Helicobacter       pylori testing. Estimated blood loss was minimal.      The examined duodenum was normal. Biopsies for histology were taken with       a cold forceps for evaluation of celiac disease. Estimated blood loss       was minimal. Impression:            - Normal esophagus.                        - Erythematous mucosa in the antrum. Biopsied.                         - Normal examined duodenum. Biopsied. Recommendation:        - Discharge patient to home.                        - Resume previous diet.                        - Continue present medications.                        - Await pathology results.                        - Return to referring physician as previously                         scheduled. Procedure Code(s):     --- Professional ---                        [phone], Esophagogastroduodenoscopy, flexible,                         transoral; with biopsy, single or multiple Diagnosis Code(s):     --- Professional ---                        K31.89, Other diseases of stomach and duodenum                        D50.9, Iron deficiency anemia, unspecified CPT copyright 2019 American Medical Association. All rights reserved. The codes documented in this report are preliminary and upon coder review may  be revised to meet current compliance requirements. Andrey Farmer, MD Andrey Farmer MD, MD 11/15/2020 11:25:16 AM Number of Addenda: 0 Note Initiated On: 11/15/2020 10:33 AM Estimated Blood Loss:  Estimated blood loss was minimal.      [facility]

## 2020-11-15 NOTE — Anesthesia Postprocedure Evaluation (Signed)
Anesthesia Post Note  Patient: Ronald Sparks  Procedure(s) Performed: ESOPHAGOGASTRODUODENOSCOPY (EGD) WITH PROPOFOL (N/A ) COLONOSCOPY WITH PROPOFOL (N/A )  Patient location during evaluation: Endoscopy Anesthesia Type: General Level of consciousness: awake and alert Pain management: pain level controlled Vital Signs Assessment: post-procedure vital signs reviewed and stable Respiratory status: spontaneous breathing and respiratory function stable Cardiovascular status: stable Anesthetic complications: no   No complications documented.   Last Vitals:  Vitals:   11/15/20 0952 11/15/20 1131  BP: 132/88 108/74  Pulse: 77 82  Resp: 20 13  Temp: (!) 36 C (!) 36.1 C  SpO2: 98% 95%    Last Pain:  Vitals:   11/15/20 1131  TempSrc: Temporal  PainSc: Asleep                 Ronald Sparks K

## 2020-11-15 NOTE — Op Note (Signed)
[facility] Gastroenterology Patient Name: Ronald Sparks Procedure Date: 11/15/2020 10:32 AM MRN: [id number] Account #: [id number] Date of Birth: March 03, 1976 Admit Type: Outpatient Age: 44 Room: [facility] ENDO ROOM 1 Gender: Male Note Status: Finalized Procedure:             Colonoscopy Indications:           Iron deficiency anemia Providers:             Andrey Farmer MD, MD Medicines:             Monitored Anesthesia Care Complications:         No immediate complications. Estimated blood loss:                         Minimal. Procedure:             Pre-Anesthesia Assessment:                        - Prior to the procedure, a History and Physical was                         performed, and patient medications and allergies were                         reviewed. The patient is competent. The risks and                         benefits of the procedure and the sedation options and                         risks were discussed with the patient. All questions                         were answered and informed consent was obtained.                         Patient identification and proposed procedure were                         verified by the physician, the nurse, the anesthetist                         and the technician in the endoscopy suite. Mental                         Status Examination: alert and oriented. Airway                         Examination: normal oropharyngeal airway and neck                         mobility. Respiratory Examination: clear to                         auscultation. CV Examination: normal. Prophylactic                         Antibiotics: The patient does not require prophylactic  antibiotics. Prior Anticoagulants: The patient has                         taken no previous anticoagulant or antiplatelet                         agents. ASA Grade Assessment: II - A patient with mild                         systemic  disease. After reviewing the risks and                         benefits, the patient was deemed in satisfactory                         condition to undergo the procedure. The anesthesia                         plan was to use monitored anesthesia care (MAC).                         Immediately prior to administration of medications,                         the patient was re-assessed for adequacy to receive                         sedatives. The heart rate, respiratory rate, oxygen                         saturations, blood pressure, adequacy of pulmonary                         ventilation, and response to care were monitored                         throughout the procedure. The physical status of the                         patient was re-assessed after the procedure.                        After obtaining informed consent, the colonoscope was                         passed under direct vision. Throughout the procedure,                         the patient's blood pressure, pulse, and oxygen                         saturations were monitored continuously. The                         Colonoscope was introduced through the anus and                         advanced to the the terminal ileum. The colonoscopy  was performed without difficulty. The patient                         tolerated the procedure well. The quality of the bowel                         preparation was good. Findings:      The perianal and digital rectal examinations were normal.      The terminal ileum appeared normal.      A suspected continuous area of nonbleeding ulcerated mucosa with no       stigmata of recent bleeding was present in the sigmoid colon, in the       descending colon, in the transverse colon and in the ascending colon.       These changes were extremely mild. Biopsies were taken with a cold       forceps for histology. Estimated blood loss was minimal.      Non-bleeding internal  hemorrhoids were found during retroflexion. The       hemorrhoids were Grade I (internal hemorrhoids that do not prolapse).      The exam was otherwise without abnormality on direct and retroflexion       views. Impression:            - The examined portion of the ileum was normal.                        - Mucosal ulceration. Biopsied.                        - Non-bleeding internal hemorrhoids.                        - The examination was otherwise normal on direct and                         retroflexion views. Recommendation:        - Discharge patient to home.                        - Resume previous diet.                        - Continue present medications.                        - Await pathology results.                        - Repeat colonoscopy for surveillance based on                         pathology results.                        - Return to referring physician as previously                         scheduled. Procedure Code(s):     --- Professional ---                        [phone], Colonoscopy, flexible; with biopsy, single or  multiple Diagnosis Code(s):     --- Professional ---                        K64.0, First degree hemorrhoids                        K63.3, Ulcer of intestine                        D50.9, Iron deficiency anemia, unspecified CPT copyright 2019 American Medical Association. All rights reserved. The codes documented in this report are preliminary and upon coder review may  be revised to meet current compliance requirements. Andrey Farmer, MD Andrey Farmer MD, MD 11/15/2020 11:29:17 AM Number of Addenda: 0 Note Initiated On: 11/15/2020 10:32 AM Scope Withdrawal Time: 0 hours 10 minutes 43 seconds  Total Procedure Duration: 0 hours 13 minutes 11 seconds  Estimated Blood Loss:  Estimated blood loss was minimal.      [facility]

## 2020-11-16 ENCOUNTER — Encounter: Payer: Self-pay | Admitting: Gastroenterology

## 2020-11-17 LAB — SURGICAL PATHOLOGY

## 2020-12-13 ENCOUNTER — Telehealth: Payer: Self-pay | Admitting: Oncology

## 2020-12-13 ENCOUNTER — Inpatient Hospital Stay: Payer: BLUE CROSS/BLUE SHIELD

## 2020-12-13 NOTE — Telephone Encounter (Signed)
Pt called to cancel lab and MD appt to 1/25 and 1/26. Pt had a emergency out of town. Will call back to reschedule appts once he checks his schedule.

## 2020-12-14 ENCOUNTER — Inpatient Hospital Stay: Payer: BLUE CROSS/BLUE SHIELD | Admitting: Oncology

## 2021-01-09 ENCOUNTER — Encounter: Payer: Self-pay | Admitting: Gastroenterology

## 2021-12-11 DIAGNOSIS — R5383 Other fatigue: Secondary | ICD-10-CM | POA: Diagnosis not present

## 2021-12-11 DIAGNOSIS — Z9989 Dependence on other enabling machines and devices: Secondary | ICD-10-CM | POA: Diagnosis not present

## 2021-12-11 DIAGNOSIS — Z6841 Body Mass Index (BMI) 40.0 and over, adult: Secondary | ICD-10-CM | POA: Diagnosis not present

## 2021-12-11 DIAGNOSIS — G4733 Obstructive sleep apnea (adult) (pediatric): Secondary | ICD-10-CM | POA: Diagnosis not present

## 2021-12-19 DIAGNOSIS — R519 Headache, unspecified: Secondary | ICD-10-CM | POA: Diagnosis not present

## 2021-12-19 DIAGNOSIS — M5412 Radiculopathy, cervical region: Secondary | ICD-10-CM | POA: Diagnosis not present

## 2021-12-19 DIAGNOSIS — M9903 Segmental and somatic dysfunction of lumbar region: Secondary | ICD-10-CM | POA: Diagnosis not present

## 2021-12-19 DIAGNOSIS — M9901 Segmental and somatic dysfunction of cervical region: Secondary | ICD-10-CM | POA: Diagnosis not present

## 2021-12-28 ENCOUNTER — Ambulatory Visit: Payer: Self-pay

## 2022-01-02 DIAGNOSIS — M9901 Segmental and somatic dysfunction of cervical region: Secondary | ICD-10-CM | POA: Diagnosis not present

## 2022-01-02 DIAGNOSIS — M5412 Radiculopathy, cervical region: Secondary | ICD-10-CM | POA: Diagnosis not present

## 2022-01-02 DIAGNOSIS — M9903 Segmental and somatic dysfunction of lumbar region: Secondary | ICD-10-CM | POA: Diagnosis not present

## 2022-01-02 DIAGNOSIS — R519 Headache, unspecified: Secondary | ICD-10-CM | POA: Diagnosis not present

## 2022-01-30 DIAGNOSIS — M9901 Segmental and somatic dysfunction of cervical region: Secondary | ICD-10-CM | POA: Diagnosis not present

## 2022-01-30 DIAGNOSIS — R519 Headache, unspecified: Secondary | ICD-10-CM | POA: Diagnosis not present

## 2022-01-30 DIAGNOSIS — M5412 Radiculopathy, cervical region: Secondary | ICD-10-CM | POA: Diagnosis not present

## 2022-01-30 DIAGNOSIS — M9903 Segmental and somatic dysfunction of lumbar region: Secondary | ICD-10-CM | POA: Diagnosis not present

## 2022-02-05 DIAGNOSIS — G4733 Obstructive sleep apnea (adult) (pediatric): Secondary | ICD-10-CM | POA: Diagnosis not present

## 2022-02-13 DIAGNOSIS — M5412 Radiculopathy, cervical region: Secondary | ICD-10-CM | POA: Diagnosis not present

## 2022-02-13 DIAGNOSIS — M9903 Segmental and somatic dysfunction of lumbar region: Secondary | ICD-10-CM | POA: Diagnosis not present

## 2022-02-13 DIAGNOSIS — R519 Headache, unspecified: Secondary | ICD-10-CM | POA: Diagnosis not present

## 2022-02-13 DIAGNOSIS — M9901 Segmental and somatic dysfunction of cervical region: Secondary | ICD-10-CM | POA: Diagnosis not present

## 2022-03-01 ENCOUNTER — Other Ambulatory Visit: Payer: Self-pay | Admitting: Physician Assistant

## 2022-03-01 ENCOUNTER — Ambulatory Visit
Admission: RE | Admit: 2022-03-01 | Discharge: 2022-03-01 | Disposition: A | Discharge status: Home / Self Care | Payer: 59 | Source: Ambulatory Visit | Attending: Physician Assistant | Admitting: Physician Assistant

## 2022-03-01 ENCOUNTER — Encounter: Payer: Self-pay | Admitting: Oncology

## 2022-03-01 DIAGNOSIS — M79605 Pain in left leg: Secondary | ICD-10-CM | POA: Insufficient documentation

## 2022-03-01 DIAGNOSIS — I10 Essential (primary) hypertension: Secondary | ICD-10-CM | POA: Diagnosis not present

## 2022-03-01 DIAGNOSIS — M7989 Other specified soft tissue disorders: Secondary | ICD-10-CM | POA: Diagnosis not present

## 2022-03-01 DIAGNOSIS — M79662 Pain in left lower leg: Secondary | ICD-10-CM | POA: Diagnosis not present

## 2022-03-01 DIAGNOSIS — E119 Type 2 diabetes mellitus without complications: Secondary | ICD-10-CM | POA: Diagnosis not present

## 2022-03-01 DIAGNOSIS — Z6841 Body Mass Index (BMI) 40.0 and over, adult: Secondary | ICD-10-CM | POA: Diagnosis not present

## 2022-03-06 DIAGNOSIS — G4733 Obstructive sleep apnea (adult) (pediatric): Secondary | ICD-10-CM | POA: Diagnosis not present

## 2022-03-06 DIAGNOSIS — I10 Essential (primary) hypertension: Secondary | ICD-10-CM | POA: Diagnosis not present

## 2022-03-06 DIAGNOSIS — E119 Type 2 diabetes mellitus without complications: Secondary | ICD-10-CM | POA: Diagnosis not present

## 2022-03-08 DIAGNOSIS — G4733 Obstructive sleep apnea (adult) (pediatric): Secondary | ICD-10-CM | POA: Diagnosis not present

## 2022-03-12 DIAGNOSIS — M9903 Segmental and somatic dysfunction of lumbar region: Secondary | ICD-10-CM | POA: Diagnosis not present

## 2022-03-12 DIAGNOSIS — R519 Headache, unspecified: Secondary | ICD-10-CM | POA: Diagnosis not present

## 2022-03-12 DIAGNOSIS — M5412 Radiculopathy, cervical region: Secondary | ICD-10-CM | POA: Diagnosis not present

## 2022-03-12 DIAGNOSIS — M9901 Segmental and somatic dysfunction of cervical region: Secondary | ICD-10-CM | POA: Diagnosis not present

## 2022-04-03 DIAGNOSIS — E119 Type 2 diabetes mellitus without complications: Secondary | ICD-10-CM | POA: Diagnosis not present

## 2022-04-03 DIAGNOSIS — E785 Hyperlipidemia, unspecified: Secondary | ICD-10-CM | POA: Diagnosis not present

## 2022-04-03 DIAGNOSIS — E1169 Type 2 diabetes mellitus with other specified complication: Secondary | ICD-10-CM | POA: Diagnosis not present

## 2022-04-07 DIAGNOSIS — G4733 Obstructive sleep apnea (adult) (pediatric): Secondary | ICD-10-CM | POA: Diagnosis not present

## 2022-04-09 DIAGNOSIS — M9903 Segmental and somatic dysfunction of lumbar region: Secondary | ICD-10-CM | POA: Diagnosis not present

## 2022-04-09 DIAGNOSIS — R519 Headache, unspecified: Secondary | ICD-10-CM | POA: Diagnosis not present

## 2022-04-09 DIAGNOSIS — M5412 Radiculopathy, cervical region: Secondary | ICD-10-CM | POA: Diagnosis not present

## 2022-04-09 DIAGNOSIS — M9901 Segmental and somatic dysfunction of cervical region: Secondary | ICD-10-CM | POA: Diagnosis not present

## 2022-05-08 DIAGNOSIS — G4733 Obstructive sleep apnea (adult) (pediatric): Secondary | ICD-10-CM | POA: Diagnosis not present

## 2022-05-13 DIAGNOSIS — G4733 Obstructive sleep apnea (adult) (pediatric): Secondary | ICD-10-CM | POA: Diagnosis not present

## 2022-05-27 ENCOUNTER — Emergency Department
Admission: EM | Admit: 2022-05-27 | Discharge: 2022-05-27 | Disposition: A | Discharge status: Home / Self Care | Payer: 59 | Attending: Emergency Medicine | Admitting: Emergency Medicine

## 2022-05-27 ENCOUNTER — Encounter: Payer: Self-pay | Admitting: Intensive Care

## 2022-05-27 ENCOUNTER — Other Ambulatory Visit: Payer: Self-pay

## 2022-05-27 ENCOUNTER — Emergency Department: Payer: 59

## 2022-05-27 DIAGNOSIS — R0602 Shortness of breath: Secondary | ICD-10-CM | POA: Insufficient documentation

## 2022-05-27 DIAGNOSIS — R0789 Other chest pain: Secondary | ICD-10-CM | POA: Diagnosis not present

## 2022-05-27 DIAGNOSIS — M25512 Pain in left shoulder: Secondary | ICD-10-CM | POA: Diagnosis not present

## 2022-05-27 DIAGNOSIS — R079 Chest pain, unspecified: Secondary | ICD-10-CM | POA: Diagnosis not present

## 2022-05-27 HISTORY — DX: Sleep apnea, unspecified: G47.30

## 2022-05-27 LAB — CBC
HCT: 44.1 % (ref 39.0–52.0)
Hemoglobin: 14.4 g/dL (ref 13.0–17.0)
MCH: 27.4 pg (ref 26.0–34.0)
MCHC: 32.7 g/dL (ref 30.0–36.0)
MCV: 83.8 fL (ref 80.0–100.0)
Platelets: 235 10*3/uL (ref 150–400)
RBC: 5.26 MIL/uL (ref 4.22–5.81)
RDW: 13.7 % (ref 11.5–15.5)
WBC: 10.4 10*3/uL (ref 4.0–10.5)
nRBC: 0 % (ref 0.0–0.2)

## 2022-05-27 LAB — BASIC METABOLIC PANEL
Anion gap: 6 (ref 5–15)
BUN: 8 mg/dL (ref 6–20)
CO2: 26 mmol/L (ref 22–32)
Calcium: 8.8 mg/dL — ABNORMAL LOW (ref 8.9–10.3)
Chloride: 107 mmol/L (ref 98–111)
Creatinine, Ser: 0.67 mg/dL (ref 0.61–1.24)
GFR, Estimated: >60 mL/min (ref >60)
Glucose, Bld: 121 mg/dL — ABNORMAL HIGH (ref 70–99)
Potassium: 4 mmol/L (ref 3.5–5.1)
Sodium: 139 mmol/L (ref 135–145)

## 2022-05-27 LAB — D-DIMER, QUANTITATIVE: D-Dimer, Quant: 0.39 ug/mL-FEU (ref 0.00–0.50)

## 2022-05-27 LAB — TROPONIN I (HIGH SENSITIVITY)
Troponin I (High Sensitivity): 6 ng/L (ref <18)
Troponin I (High Sensitivity): 6 ng/L (ref <18)

## 2022-05-27 NOTE — ED Provider Notes (Signed)
Patient was signed out to me pending repeat troponin and D-dimer.  Plan is for him to be discharge if all within normal limits.  D-dimer is negative and repeat troponin is within normal limits.  Patient will be discharged with cardiology follow-up.   Georga Hacking, MD 05/27/22 [phone]

## 2022-05-27 NOTE — Discharge Instructions (Signed)
Your work-up here including your cardiac enzymes and a D-dimer to screen for blood clots were reassuring.  Please follow-up with your primary care doctor and cardiology if continue to have pain.  If your pain is changing or not going away or you develop other symptoms that are concerning to you please return to the emergency department.

## 2022-05-27 NOTE — ED Triage Notes (Signed)
Patient c/o left sided chest pain that radiates to left shoulder. Also reports sob.

## 2022-05-27 NOTE — ED Provider Notes (Signed)
[facility] Provider Note   Event Date/Time   First MD Initiated Contact with Patient 05/27/22 1348     (approximate) History  Chest Pain and Shortness of Breath  HPI Ronald Sparks is a 46 y.o. male  Location: Left upper chest wall, left shoulder, and underneath left shoulder blade Duration: 2 days prior to arrival Timing: Again waning Severity: 4/10 Quality: Aching/sharp pain Context: Patient states that since Friday he has been having aching sharp pain through his left shoulder, left anterior upper chest, and left shoulder blade with associated shortness of breath Modifying factors: Denies Associated Symptoms: Shortness of breath ROS: Patient currently denies any vision changes, tinnitus, difficulty speaking, facial droop, sore throat, chest pain, abdominal pain, nausea/vomiting/diarrhea, dysuria, or weakness/numbness/paresthesias in any extremity   Physical Exam  Triage Vital Signs: ED Triage Vitals  Enc Vitals Group     BP 05/27/22 1346 (!) 139/92     Pulse Rate 05/27/22 1346 70     Resp 05/27/22 1346 18     Temp 05/27/22 1346 98.4 F (36.9 C)     Temp Source 05/27/22 1346 Oral     SpO2 05/27/22 1346 100 %     Weight 05/27/22 1341 266 lb (120.7 kg)     Height 05/27/22 1341 5\' 9"  (1.753 m)     Head Circumference --      Peak Flow --      Pain Score 05/27/22 1340 4     Pain Loc --      Pain Edu? --      Excl. in GC? --    Most recent vital signs: Vitals:   05/27/22 1346  BP: (!) 139/92  Pulse: 70  Resp: 18  Temp: 98.4 F (36.9 C)  SpO2: 100%   General: Awake, oriented x4. CV:  Good peripheral perfusion.  Resp:  Normal effort.  Abd:  No distention.  Other:  Middle-aged 07/28/22 Asian overweight male sitting in bed in no acute distress.  Negative Spurling test on the left ED Results / Procedures / Treatments  Labs (all labs ordered are listed, but only abnormal results are displayed) Labs Reviewed  BASIC METABOLIC PANEL - Abnormal;  Notable for the following components:      Result Value   Glucose, Bld 121 (*)    Calcium 8.8 (*)    All other components within normal limits  CBC  D-DIMER, QUANTITATIVE  TROPONIN I (HIGH SENSITIVITY)   EKG ED ECG REPORT I, [place], the attending physician, personally viewed and interpreted this ECG. Date: 05/27/2022 EKG Time: 1435 Rate: 65 Rhythm: normal sinus rhythm QRS Axis: normal Intervals: normal ST/T Wave abnormalities: normal Narrative Interpretation: no evidence of acute ischemia RADIOLOGY ED MD interpretation: 2 view chest x-ray interpreted by me shows no evidence of acute abnormalities including no pneumonia, pneumothorax, or widened mediastinum -Agree with radiology assessment Official radiology report(s): DG Chest 2 View  Result Date: 05/27/2022 CLINICAL DATA:  Patient c/o left sided chest pain that radiates to left shoulder. Also reports sob EXAM: CHEST - 2 VIEW COMPARISON:  None Available. FINDINGS: Normal heart, mediastinum and hila. Lungs are clear and are symmetrically aerated. No pleural effusion or pneumothorax. Skeletal structures are intact IMPRESSION: No active cardiopulmonary disease. Electronically Signed   By: 07/28/2022 M.D.   On: 05/27/2022 14:18   PROCEDURES: Critical Care performed: No Procedures MEDICATIONS ORDERED IN ED: Medications - No data to display IMPRESSION / MDM / ASSESSMENT AND PLAN / ED COURSE  I reviewed the triage vital signs and the nursing notes.                             The patient is on the cardiac monitor to evaluate for evidence of arrhythmia and/or significant heart rate changes. Patient's presentation is most consistent with acute presentation with potential threat to life or bodily function. Workup: ECG, CXR, CBC, BMP, Troponin Findings: ECG: No overt evidence of STEMI. No evidence of Brugadas sign, delta wave, epsilon wave, significantly prolonged QTc, or malignant arrhythmia HS Troponin: Negative x1 Other  Labs unremarkable for emergent problems. CXR: Without PTX, PNA, or widened mediastinum Last Stress Test: Never Last Heart Catheterization: Never HEART Score: 2  Given History, Exam, and Workup I have low suspicion for ACS, Pneumothorax, Pneumonia, Pulmonary Embolus, Tamponade, Aortic Dissection or other emergent problem as a cause for this presentation.  Pending D-dimer and repeat troponin  Disposition: Care of this patient will be signed out to the oncoming physician at the end of my shift.  All pertinent patient information conveyed and all questions answered.  All further care and disposition decisions will be made by the oncoming physician.     FINAL CLINICAL IMPRESSION(S) / ED DIAGNOSES   Final diagnoses:  None   Rx / DC Orders   ED Discharge Orders     None      Note:  This document was prepared using Dragon voice recognition software and may include unintentional dictation errors.   Merwyn Katos, MD 05/27/22 [phone]

## 2022-05-27 NOTE — ED Notes (Signed)
Pt A&O, IV removed, pt given discharge instructions, pt ambulating with steady gait. 

## 2022-05-28 DIAGNOSIS — M9901 Segmental and somatic dysfunction of cervical region: Secondary | ICD-10-CM | POA: Diagnosis not present

## 2022-05-28 DIAGNOSIS — M9903 Segmental and somatic dysfunction of lumbar region: Secondary | ICD-10-CM | POA: Diagnosis not present

## 2022-05-28 DIAGNOSIS — R519 Headache, unspecified: Secondary | ICD-10-CM | POA: Diagnosis not present

## 2022-05-28 DIAGNOSIS — M5412 Radiculopathy, cervical region: Secondary | ICD-10-CM | POA: Diagnosis not present

## 2022-06-07 DIAGNOSIS — G4733 Obstructive sleep apnea (adult) (pediatric): Secondary | ICD-10-CM | POA: Diagnosis not present

## 2022-06-12 DIAGNOSIS — G4733 Obstructive sleep apnea (adult) (pediatric): Secondary | ICD-10-CM | POA: Diagnosis not present

## 2022-07-08 DIAGNOSIS — G4733 Obstructive sleep apnea (adult) (pediatric): Secondary | ICD-10-CM | POA: Diagnosis not present

## 2022-07-13 DIAGNOSIS — G4733 Obstructive sleep apnea (adult) (pediatric): Secondary | ICD-10-CM | POA: Diagnosis not present

## 2022-07-30 DIAGNOSIS — I10 Essential (primary) hypertension: Secondary | ICD-10-CM | POA: Diagnosis not present

## 2022-07-30 DIAGNOSIS — E119 Type 2 diabetes mellitus without complications: Secondary | ICD-10-CM | POA: Diagnosis not present

## 2022-07-31 DIAGNOSIS — M9903 Segmental and somatic dysfunction of lumbar region: Secondary | ICD-10-CM | POA: Diagnosis not present

## 2022-07-31 DIAGNOSIS — M5412 Radiculopathy, cervical region: Secondary | ICD-10-CM | POA: Diagnosis not present

## 2022-07-31 DIAGNOSIS — R519 Headache, unspecified: Secondary | ICD-10-CM | POA: Diagnosis not present

## 2022-07-31 DIAGNOSIS — M9901 Segmental and somatic dysfunction of cervical region: Secondary | ICD-10-CM | POA: Diagnosis not present

## 2022-08-06 DIAGNOSIS — Z23 Encounter for immunization: Secondary | ICD-10-CM | POA: Diagnosis not present

## 2022-08-06 DIAGNOSIS — Z9989 Dependence on other enabling machines and devices: Secondary | ICD-10-CM | POA: Diagnosis not present

## 2022-08-06 DIAGNOSIS — Z125 Encounter for screening for malignant neoplasm of prostate: Secondary | ICD-10-CM | POA: Diagnosis not present

## 2022-08-06 DIAGNOSIS — R69 Illness, unspecified: Secondary | ICD-10-CM | POA: Diagnosis not present

## 2022-08-06 DIAGNOSIS — Z0000 Encounter for general adult medical examination without abnormal findings: Secondary | ICD-10-CM | POA: Diagnosis not present

## 2022-08-06 DIAGNOSIS — G4733 Obstructive sleep apnea (adult) (pediatric): Secondary | ICD-10-CM | POA: Diagnosis not present

## 2022-08-06 DIAGNOSIS — I10 Essential (primary) hypertension: Secondary | ICD-10-CM | POA: Diagnosis not present

## 2022-08-06 DIAGNOSIS — Z1159 Encounter for screening for other viral diseases: Secondary | ICD-10-CM | POA: Diagnosis not present

## 2022-08-06 DIAGNOSIS — E119 Type 2 diabetes mellitus without complications: Secondary | ICD-10-CM | POA: Diagnosis not present

## 2022-08-08 DIAGNOSIS — G4733 Obstructive sleep apnea (adult) (pediatric): Secondary | ICD-10-CM | POA: Diagnosis not present

## 2022-08-11 DIAGNOSIS — G4733 Obstructive sleep apnea (adult) (pediatric): Secondary | ICD-10-CM | POA: Diagnosis not present

## 2022-08-23 DIAGNOSIS — R197 Diarrhea, unspecified: Secondary | ICD-10-CM | POA: Diagnosis not present

## 2022-08-23 DIAGNOSIS — K529 Noninfective gastroenteritis and colitis, unspecified: Secondary | ICD-10-CM | POA: Diagnosis not present

## 2022-09-04 DIAGNOSIS — R197 Diarrhea, unspecified: Secondary | ICD-10-CM | POA: Diagnosis not present

## 2022-09-04 DIAGNOSIS — E1169 Type 2 diabetes mellitus with other specified complication: Secondary | ICD-10-CM | POA: Diagnosis not present

## 2022-09-04 DIAGNOSIS — E785 Hyperlipidemia, unspecified: Secondary | ICD-10-CM | POA: Diagnosis not present

## 2022-09-04 DIAGNOSIS — E119 Type 2 diabetes mellitus without complications: Secondary | ICD-10-CM | POA: Diagnosis not present

## 2022-09-04 DIAGNOSIS — K529 Noninfective gastroenteritis and colitis, unspecified: Secondary | ICD-10-CM | POA: Diagnosis not present

## 2022-09-05 DIAGNOSIS — M5412 Radiculopathy, cervical region: Secondary | ICD-10-CM | POA: Diagnosis not present

## 2022-09-05 DIAGNOSIS — M9903 Segmental and somatic dysfunction of lumbar region: Secondary | ICD-10-CM | POA: Diagnosis not present

## 2022-09-05 DIAGNOSIS — M9901 Segmental and somatic dysfunction of cervical region: Secondary | ICD-10-CM | POA: Diagnosis not present

## 2022-09-05 DIAGNOSIS — R519 Headache, unspecified: Secondary | ICD-10-CM | POA: Diagnosis not present

## 2022-09-07 DIAGNOSIS — G4733 Obstructive sleep apnea (adult) (pediatric): Secondary | ICD-10-CM | POA: Diagnosis not present

## 2022-09-24 ENCOUNTER — Encounter: Payer: Self-pay | Admitting: *Deleted

## 2022-09-25 ENCOUNTER — Ambulatory Visit: Payer: 59 | Admitting: Anesthesiology

## 2022-09-25 ENCOUNTER — Encounter
Admission: RE | Disposition: A | Discharge status: Home / Self Care | Payer: Self-pay | Source: Home / Self Care | Attending: Gastroenterology

## 2022-09-25 ENCOUNTER — Ambulatory Visit
Admission: RE | Admit: 2022-09-25 | Discharge: 2022-09-25 | Disposition: A | Discharge status: Home / Self Care | Payer: 59 | Attending: Gastroenterology | Admitting: Gastroenterology

## 2022-09-25 DIAGNOSIS — K5100 Ulcerative (chronic) pancolitis without complications: Secondary | ICD-10-CM | POA: Diagnosis not present

## 2022-09-25 DIAGNOSIS — K6289 Other specified diseases of anus and rectum: Secondary | ICD-10-CM | POA: Insufficient documentation

## 2022-09-25 DIAGNOSIS — D649 Anemia, unspecified: Secondary | ICD-10-CM | POA: Diagnosis not present

## 2022-09-25 DIAGNOSIS — K640 First degree hemorrhoids: Secondary | ICD-10-CM | POA: Insufficient documentation

## 2022-09-25 DIAGNOSIS — K5120 Ulcerative (chronic) proctitis without complications: Secondary | ICD-10-CM | POA: Diagnosis not present

## 2022-09-25 DIAGNOSIS — E119 Type 2 diabetes mellitus without complications: Secondary | ICD-10-CM | POA: Insufficient documentation

## 2022-09-25 DIAGNOSIS — G4730 Sleep apnea, unspecified: Secondary | ICD-10-CM | POA: Diagnosis not present

## 2022-09-25 DIAGNOSIS — J4520 Mild intermittent asthma, uncomplicated: Secondary | ICD-10-CM | POA: Diagnosis not present

## 2022-09-25 DIAGNOSIS — J45909 Unspecified asthma, uncomplicated: Secondary | ICD-10-CM | POA: Diagnosis not present

## 2022-09-25 DIAGNOSIS — K5190 Ulcerative colitis, unspecified, without complications: Secondary | ICD-10-CM | POA: Diagnosis not present

## 2022-09-25 DIAGNOSIS — D759 Disease of blood and blood-forming organs, unspecified: Secondary | ICD-10-CM | POA: Diagnosis not present

## 2022-09-25 DIAGNOSIS — K219 Gastro-esophageal reflux disease without esophagitis: Secondary | ICD-10-CM | POA: Diagnosis not present

## 2022-09-25 HISTORY — PX: COLONOSCOPY WITH PROPOFOL: SHX5780

## 2022-09-25 LAB — GLUCOSE, CAPILLARY: Glucose-Capillary: 98 mg/dL (ref 70–99)

## 2022-09-25 SURGERY — COLONOSCOPY WITH PROPOFOL
Anesthesia: General

## 2022-09-25 MED ORDER — PROPOFOL 10 MG/ML IV BOLUS
INTRAVENOUS | Status: DC | PRN
  Administered 2022-09-25: 50 mg via INTRAVENOUS
  Administered 2022-09-25: 100 mg via INTRAVENOUS

## 2022-09-25 MED ORDER — PROPOFOL 500 MG/50ML IV EMUL
INTRAVENOUS | Status: DC | PRN
  Administered 2022-09-25: 150 ug/kg/min via INTRAVENOUS

## 2022-09-25 MED ORDER — DEXMEDETOMIDINE HCL IN NACL 80 MCG/20ML IV SOLN
INTRAVENOUS | Status: DC | PRN
  Administered 2022-09-25: 4 ug via BUCCAL
  Administered 2022-09-25: 8 ug via BUCCAL

## 2022-09-25 MED ORDER — SODIUM CHLORIDE 0.9 % IV SOLN
INTRAVENOUS | Status: DC
  Administered 2022-09-25: 20 mL/h via INTRAVENOUS

## 2022-09-25 MED ORDER — PROPOFOL 1000 MG/100ML IV EMUL
INTRAVENOUS | Status: AC
Start: 2022-09-25 — End: ?
  Filled 2022-09-25: qty 100

## 2022-09-25 MED ORDER — LIDOCAINE 2% (20 MG/ML) 5 ML SYRINGE
INTRAMUSCULAR | Status: DC | PRN
  Administered 2022-09-25: 50 mg via INTRAVENOUS

## 2022-09-25 NOTE — Interval H&P Note (Signed)
History and Physical Interval Note:  09/25/2022 10:05 AM  Ronald Sparks  has presented today for surgery, with the diagnosis of Colitis Diarrhea.  The various methods of treatment have been discussed with the patient and family. After consideration of risks, benefits and other options for treatment, the patient has consented to  Procedure(s): COLONOSCOPY WITH PROPOFOL (N/A) as a surgical intervention.  The patient's history has been reviewed, patient examined, no change in status, stable for surgery.  I have reviewed the patient's chart and labs.  Questions were answered to the patient's satisfaction.     [name]  Ok to proceed with colonoscopy

## 2022-09-25 NOTE — Anesthesia Postprocedure Evaluation (Signed)
Anesthesia Post Note  Patient: Kino Dunsworth  Procedure(s) Performed: COLONOSCOPY WITH PROPOFOL  Patient location during evaluation: PACU Anesthesia Type: General Level of consciousness: awake and alert Pain management: pain level controlled Vital Signs Assessment: post-procedure vital signs reviewed and stable Respiratory status: spontaneous breathing, nonlabored ventilation, respiratory function stable and patient connected to nasal cannula oxygen Cardiovascular status: blood pressure returned to baseline and stable Postop Assessment: no apparent nausea or vomiting Anesthetic complications: no   No notable events documented.   Last Vitals:  Vitals:   09/25/22 0938 09/25/22 1032  BP: (!) 140/97 93/65  Pulse: 70 72  Resp: 20 17  Temp: 37.1 C (!) 36 C  SpO2: 98% 96%    Last Pain:  Vitals:   09/25/22 1032  TempSrc: Temporal  PainSc: [place] Estephan Gallardo

## 2022-09-25 NOTE — Op Note (Signed)
[facility] Gastroenterology Patient Name: Ronald Sparks Procedure Date: 09/25/2022 10:05 AM MRN: [id number] Account #: [id number] Date of Birth: 1975-12-14 Admit Type: Outpatient Age: 46 Room: [facility] ENDO ROOM 1 Gender: Male Note Status: Finalized Instrument Name: Park Meo [id number] Procedure:             Colonoscopy Indications:           Chronic ulcerative pancolitis, Chronic diarrhea Providers:             Andrey Farmer MD, MD Medicines:             Monitored Anesthesia Care Complications:         No immediate complications. Estimated blood loss:                         Minimal. Procedure:             Pre-Anesthesia Assessment:                        - Prior to the procedure, a History and Physical was                         performed, and patient medications and allergies were                         reviewed. The patient is competent. The risks and                         benefits of the procedure and the sedation options and                         risks were discussed with the patient. All questions                         were answered and informed consent was obtained.                         Patient identification and proposed procedure were                         verified by the physician, the nurse, the                         anesthesiologist, the anesthetist and the technician                         in the endoscopy suite. Mental Status Examination:                         alert and oriented. Airway Examination: normal                         oropharyngeal airway and neck mobility. Respiratory                         Examination: clear to auscultation. CV Examination:                         normal. Prophylactic Antibiotics: The patient does not  require prophylactic antibiotics. Prior                         Anticoagulants: The patient has taken no anticoagulant                         or antiplatelet agents. ASA  Grade Assessment: II - A                         patient with mild systemic disease. After reviewing                         the risks and benefits, the patient was deemed in                         satisfactory condition to undergo the procedure. The                         anesthesia plan was to use monitored anesthesia care                         (MAC). Immediately prior to administration of                         medications, the patient was re-assessed for adequacy                         to receive sedatives. The heart rate, respiratory                         rate, oxygen saturations, blood pressure, adequacy of                         pulmonary ventilation, and response to care were                         monitored throughout the procedure. The physical                         status of the patient was re-assessed after the                         procedure.                        After obtaining informed consent, the colonoscope was                         passed under direct vision. Throughout the procedure,                         the patient's blood pressure, pulse, and oxygen                         saturations were monitored continuously. The                         Colonoscope was introduced through the anus and  advanced to the the terminal ileum. The colonoscopy                         was performed without difficulty. The patient                         tolerated the procedure well. The quality of the bowel                         preparation was good. The terminal ileum, ileocecal                         valve, appendiceal orifice, and rectum were                         photographed. Findings:      The perianal and digital rectal examinations were normal.      The terminal ileum appeared normal.      Inflammation characterized by erosions and erythema was found in a       continuous and circumferential pattern from the anus to the cecum. The        inflammation was mild in severity, and when compared to previous       examinations, the findings are unchanged. Biopsies were taken with a       cold forceps for histology. Estimated blood loss was minimal.      Internal hemorrhoids were found during retroflexion. The hemorrhoids       were Grade I (internal hemorrhoids that do not prolapse).      The exam was otherwise without abnormality on direct and retroflexion       views. Impression:            - The examined portion of the ileum was normal.                        - Pancolitis. Inflammation was found from the anus to                         the cecum. This was mild in severity, unchanged                         compared to previous examinations. Biopsied.                        - Internal hemorrhoids.                        - The examination was otherwise normal on direct and                         retroflexion views. Recommendation:        - Discharge patient to home.                        - Resume previous diet.                        - Continue present medications.                        -  Await pathology results.                        - Repeat colonoscopy at appointment to be scheduled to                         check healing.                        - Return to referring physician as previously                         scheduled. Procedure Code(s):     --- Professional ---                        [phone], Colonoscopy, flexible; with biopsy, single or                         multiple Diagnosis Code(s):     --- Professional ---                        K64.0, First degree hemorrhoids                        K52.9, Noninfective gastroenteritis and colitis,                         unspecified                        K51.00, Ulcerative (chronic) pancolitis without                         complications CPT copyright 2022 American Medical Association. All rights reserved. The codes documented in this report are preliminary and  upon coder review may  be revised to meet current compliance requirements. Andrey Farmer MD, MD 09/25/2022 10:34:42 AM Number of Addenda: 0 Note Initiated On: 09/25/2022 10:05 AM Scope Withdrawal Time: 0 hours 8 minutes 38 seconds  Total Procedure Duration: 0 hours 13 minutes 35 seconds  Estimated Blood Loss:  Estimated blood loss was minimal.      [facility]
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
## 2022-09-25 NOTE — Transfer of Care (Signed)
Immediate Anesthesia Transfer of Care Note  Patient: Ronald Sparks  Procedure(s) Performed: COLONOSCOPY WITH PROPOFOL  Patient Location: Endoscopy Unit  Anesthesia Type:General  Level of Consciousness: drowsy  Airway & Oxygen Therapy: Patient Spontanous Breathing  Post-op Assessment: Report given to RN and Post -op Vital signs reviewed and stable  Post vital signs: Reviewed and stable  Last Vitals:  Vitals Value Taken Time  BP 93/65 09/25/22 1032  Temp 36 C 09/25/22 1032  Pulse 72 09/25/22 1032  Resp 17 09/25/22 1032  SpO2 96 % 09/25/22 1032    Last Pain:  Vitals:   09/25/22 1032  TempSrc: Temporal  PainSc: Asleep         Complications: No notable events documented.
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
## 2022-09-26 ENCOUNTER — Encounter: Payer: Self-pay | Admitting: Gastroenterology

## 2022-09-26 LAB — SURGICAL PATHOLOGY

## 2022-10-08 DIAGNOSIS — G4733 Obstructive sleep apnea (adult) (pediatric): Secondary | ICD-10-CM | POA: Diagnosis not present

## 2022-10-11 DIAGNOSIS — G4733 Obstructive sleep apnea (adult) (pediatric): Secondary | ICD-10-CM | POA: Diagnosis not present

## 2022-10-29 DIAGNOSIS — R519 Headache, unspecified: Secondary | ICD-10-CM | POA: Diagnosis not present

## 2022-10-29 DIAGNOSIS — M9901 Segmental and somatic dysfunction of cervical region: Secondary | ICD-10-CM | POA: Diagnosis not present

## 2022-10-29 DIAGNOSIS — M9903 Segmental and somatic dysfunction of lumbar region: Secondary | ICD-10-CM | POA: Diagnosis not present

## 2022-10-29 DIAGNOSIS — M5412 Radiculopathy, cervical region: Secondary | ICD-10-CM | POA: Diagnosis not present

## 2022-11-07 DIAGNOSIS — G4733 Obstructive sleep apnea (adult) (pediatric): Secondary | ICD-10-CM | POA: Diagnosis not present

## 2022-11-13 DIAGNOSIS — G4733 Obstructive sleep apnea (adult) (pediatric): Secondary | ICD-10-CM | POA: Diagnosis not present

## 2022-12-14 DIAGNOSIS — G4733 Obstructive sleep apnea (adult) (pediatric): Secondary | ICD-10-CM | POA: Diagnosis not present

## 2023-01-09 DIAGNOSIS — M9903 Segmental and somatic dysfunction of lumbar region: Secondary | ICD-10-CM | POA: Diagnosis not present

## 2023-01-09 DIAGNOSIS — M5412 Radiculopathy, cervical region: Secondary | ICD-10-CM | POA: Diagnosis not present

## 2023-01-09 DIAGNOSIS — R519 Headache, unspecified: Secondary | ICD-10-CM | POA: Diagnosis not present

## 2023-01-09 DIAGNOSIS — M9901 Segmental and somatic dysfunction of cervical region: Secondary | ICD-10-CM | POA: Diagnosis not present

## 2023-01-14 DIAGNOSIS — K529 Noninfective gastroenteritis and colitis, unspecified: Secondary | ICD-10-CM | POA: Diagnosis not present

## 2023-01-14 DIAGNOSIS — G4733 Obstructive sleep apnea (adult) (pediatric): Secondary | ICD-10-CM | POA: Diagnosis not present

## 2023-01-14 DIAGNOSIS — R197 Diarrhea, unspecified: Secondary | ICD-10-CM | POA: Diagnosis not present

## 2023-02-04 DIAGNOSIS — Z6841 Body Mass Index (BMI) 40.0 and over, adult: Secondary | ICD-10-CM | POA: Diagnosis not present

## 2023-02-04 DIAGNOSIS — Z125 Encounter for screening for malignant neoplasm of prostate: Secondary | ICD-10-CM | POA: Diagnosis not present

## 2023-02-04 DIAGNOSIS — G4733 Obstructive sleep apnea (adult) (pediatric): Secondary | ICD-10-CM | POA: Diagnosis not present

## 2023-02-04 DIAGNOSIS — Z114 Encounter for screening for human immunodeficiency virus [HIV]: Secondary | ICD-10-CM | POA: Diagnosis not present

## 2023-02-04 DIAGNOSIS — E119 Type 2 diabetes mellitus without complications: Secondary | ICD-10-CM | POA: Diagnosis not present

## 2023-02-04 DIAGNOSIS — Z1159 Encounter for screening for other viral diseases: Secondary | ICD-10-CM | POA: Diagnosis not present

## 2023-02-04 DIAGNOSIS — I10 Essential (primary) hypertension: Secondary | ICD-10-CM | POA: Diagnosis not present

## 2023-02-05 DIAGNOSIS — E119 Type 2 diabetes mellitus without complications: Secondary | ICD-10-CM | POA: Diagnosis not present

## 2023-02-05 DIAGNOSIS — E1169 Type 2 diabetes mellitus with other specified complication: Secondary | ICD-10-CM | POA: Diagnosis not present

## 2023-02-05 DIAGNOSIS — E785 Hyperlipidemia, unspecified: Secondary | ICD-10-CM | POA: Diagnosis not present

## 2023-02-11 DIAGNOSIS — G4733 Obstructive sleep apnea (adult) (pediatric): Secondary | ICD-10-CM | POA: Diagnosis not present

## 2023-03-14 DIAGNOSIS — G4733 Obstructive sleep apnea (adult) (pediatric): Secondary | ICD-10-CM | POA: Diagnosis not present

## 2023-04-04 DIAGNOSIS — R519 Headache, unspecified: Secondary | ICD-10-CM | POA: Diagnosis not present

## 2023-04-04 DIAGNOSIS — M5412 Radiculopathy, cervical region: Secondary | ICD-10-CM | POA: Diagnosis not present

## 2023-04-04 DIAGNOSIS — M9903 Segmental and somatic dysfunction of lumbar region: Secondary | ICD-10-CM | POA: Diagnosis not present

## 2023-04-04 DIAGNOSIS — M9901 Segmental and somatic dysfunction of cervical region: Secondary | ICD-10-CM | POA: Diagnosis not present

## 2023-04-13 DIAGNOSIS — G4733 Obstructive sleep apnea (adult) (pediatric): Secondary | ICD-10-CM | POA: Diagnosis not present

## 2023-05-06 DIAGNOSIS — H524 Presbyopia: Secondary | ICD-10-CM | POA: Diagnosis not present

## 2023-05-06 DIAGNOSIS — E113293 Type 2 diabetes mellitus with mild nonproliferative diabetic retinopathy without macular edema, bilateral: Secondary | ICD-10-CM | POA: Diagnosis not present

## 2023-05-09 DIAGNOSIS — Z6838 Body mass index (BMI) 38.0-38.9, adult: Secondary | ICD-10-CM | POA: Diagnosis not present

## 2023-05-09 DIAGNOSIS — E785 Hyperlipidemia, unspecified: Secondary | ICD-10-CM | POA: Diagnosis not present

## 2023-05-09 DIAGNOSIS — Z008 Encounter for other general examination: Secondary | ICD-10-CM | POA: Diagnosis not present

## 2023-05-09 DIAGNOSIS — Z8249 Family history of ischemic heart disease and other diseases of the circulatory system: Secondary | ICD-10-CM | POA: Diagnosis not present

## 2023-05-09 DIAGNOSIS — Z833 Family history of diabetes mellitus: Secondary | ICD-10-CM | POA: Diagnosis not present

## 2023-05-09 DIAGNOSIS — G4733 Obstructive sleep apnea (adult) (pediatric): Secondary | ICD-10-CM | POA: Diagnosis not present

## 2023-05-09 DIAGNOSIS — E119 Type 2 diabetes mellitus without complications: Secondary | ICD-10-CM | POA: Diagnosis not present

## 2023-05-09 DIAGNOSIS — K5190 Ulcerative colitis, unspecified, without complications: Secondary | ICD-10-CM | POA: Diagnosis not present

## 2023-05-09 DIAGNOSIS — Z7985 Long-term (current) use of injectable non-insulin antidiabetic drugs: Secondary | ICD-10-CM | POA: Diagnosis not present

## 2023-05-09 DIAGNOSIS — Z823 Family history of stroke: Secondary | ICD-10-CM | POA: Diagnosis not present

## 2023-05-09 DIAGNOSIS — Z87891 Personal history of nicotine dependence: Secondary | ICD-10-CM | POA: Diagnosis not present

## 2023-05-15 DIAGNOSIS — G4733 Obstructive sleep apnea (adult) (pediatric): Secondary | ICD-10-CM | POA: Diagnosis not present

## 2023-06-04 DIAGNOSIS — M9903 Segmental and somatic dysfunction of lumbar region: Secondary | ICD-10-CM | POA: Diagnosis not present

## 2023-06-04 DIAGNOSIS — R519 Headache, unspecified: Secondary | ICD-10-CM | POA: Diagnosis not present

## 2023-06-04 DIAGNOSIS — M9901 Segmental and somatic dysfunction of cervical region: Secondary | ICD-10-CM | POA: Diagnosis not present

## 2023-06-04 DIAGNOSIS — M5412 Radiculopathy, cervical region: Secondary | ICD-10-CM | POA: Diagnosis not present

## 2023-06-14 DIAGNOSIS — G4733 Obstructive sleep apnea (adult) (pediatric): Secondary | ICD-10-CM | POA: Diagnosis not present

## 2023-08-02 DIAGNOSIS — M5412 Radiculopathy, cervical region: Secondary | ICD-10-CM | POA: Diagnosis not present

## 2023-08-02 DIAGNOSIS — M9903 Segmental and somatic dysfunction of lumbar region: Secondary | ICD-10-CM | POA: Diagnosis not present

## 2023-08-02 DIAGNOSIS — I10 Essential (primary) hypertension: Secondary | ICD-10-CM | POA: Diagnosis not present

## 2023-08-02 DIAGNOSIS — E119 Type 2 diabetes mellitus without complications: Secondary | ICD-10-CM | POA: Diagnosis not present

## 2023-08-02 DIAGNOSIS — R519 Headache, unspecified: Secondary | ICD-10-CM | POA: Diagnosis not present

## 2023-08-02 DIAGNOSIS — M9901 Segmental and somatic dysfunction of cervical region: Secondary | ICD-10-CM | POA: Diagnosis not present

## 2023-08-05 DIAGNOSIS — M9903 Segmental and somatic dysfunction of lumbar region: Secondary | ICD-10-CM | POA: Diagnosis not present

## 2023-08-05 DIAGNOSIS — R519 Headache, unspecified: Secondary | ICD-10-CM | POA: Diagnosis not present

## 2023-08-05 DIAGNOSIS — M5412 Radiculopathy, cervical region: Secondary | ICD-10-CM | POA: Diagnosis not present

## 2023-08-05 DIAGNOSIS — M9901 Segmental and somatic dysfunction of cervical region: Secondary | ICD-10-CM | POA: Diagnosis not present

## 2023-08-06 DIAGNOSIS — E1169 Type 2 diabetes mellitus with other specified complication: Secondary | ICD-10-CM | POA: Diagnosis not present

## 2023-08-06 DIAGNOSIS — E785 Hyperlipidemia, unspecified: Secondary | ICD-10-CM | POA: Diagnosis not present

## 2023-08-06 DIAGNOSIS — E119 Type 2 diabetes mellitus without complications: Secondary | ICD-10-CM | POA: Diagnosis not present

## 2023-08-13 DIAGNOSIS — E119 Type 2 diabetes mellitus without complications: Secondary | ICD-10-CM | POA: Diagnosis not present

## 2023-08-13 DIAGNOSIS — I10 Essential (primary) hypertension: Secondary | ICD-10-CM | POA: Diagnosis not present

## 2023-08-13 DIAGNOSIS — Z6841 Body Mass Index (BMI) 40.0 and over, adult: Secondary | ICD-10-CM | POA: Diagnosis not present

## 2023-08-13 DIAGNOSIS — K51919 Ulcerative colitis, unspecified with unspecified complications: Secondary | ICD-10-CM | POA: Diagnosis not present

## 2023-08-13 DIAGNOSIS — Z0000 Encounter for general adult medical examination without abnormal findings: Secondary | ICD-10-CM | POA: Diagnosis not present

## 2023-08-13 DIAGNOSIS — G4733 Obstructive sleep apnea (adult) (pediatric): Secondary | ICD-10-CM | POA: Diagnosis not present

## 2023-08-13 DIAGNOSIS — Z23 Encounter for immunization: Secondary | ICD-10-CM | POA: Diagnosis not present

## 2023-08-29 DIAGNOSIS — K529 Noninfective gastroenteritis and colitis, unspecified: Secondary | ICD-10-CM | POA: Diagnosis not present

## 2023-09-12 DIAGNOSIS — G4733 Obstructive sleep apnea (adult) (pediatric): Secondary | ICD-10-CM | POA: Diagnosis not present

## 2023-10-13 DIAGNOSIS — G4733 Obstructive sleep apnea (adult) (pediatric): Secondary | ICD-10-CM | POA: Diagnosis not present

## 2023-10-15 ENCOUNTER — Encounter: Payer: Self-pay | Admitting: *Deleted

## 2023-10-25 DIAGNOSIS — M9903 Segmental and somatic dysfunction of lumbar region: Secondary | ICD-10-CM | POA: Diagnosis not present

## 2023-10-25 DIAGNOSIS — R519 Headache, unspecified: Secondary | ICD-10-CM | POA: Diagnosis not present

## 2023-10-25 DIAGNOSIS — M5412 Radiculopathy, cervical region: Secondary | ICD-10-CM | POA: Diagnosis not present

## 2023-10-25 DIAGNOSIS — M9901 Segmental and somatic dysfunction of cervical region: Secondary | ICD-10-CM | POA: Diagnosis not present

## 2023-11-01 DIAGNOSIS — M9901 Segmental and somatic dysfunction of cervical region: Secondary | ICD-10-CM | POA: Diagnosis not present

## 2023-11-01 DIAGNOSIS — R519 Headache, unspecified: Secondary | ICD-10-CM | POA: Diagnosis not present

## 2023-11-01 DIAGNOSIS — M5412 Radiculopathy, cervical region: Secondary | ICD-10-CM | POA: Diagnosis not present

## 2023-11-01 DIAGNOSIS — M9903 Segmental and somatic dysfunction of lumbar region: Secondary | ICD-10-CM | POA: Diagnosis not present

## 2023-11-19 ENCOUNTER — Other Ambulatory Visit: Payer: Self-pay

## 2023-11-19 ENCOUNTER — Ambulatory Visit
Admission: RE | Admit: 2023-11-19 | Discharge: 2023-11-19 | Disposition: A | Discharge status: Home / Self Care | Payer: 59 | Source: Ambulatory Visit | Attending: Gastroenterology | Admitting: Gastroenterology

## 2023-11-19 ENCOUNTER — Ambulatory Visit: Payer: 59 | Admitting: Anesthesiology

## 2023-11-19 ENCOUNTER — Encounter
Admission: RE | Disposition: A | Discharge status: Home / Self Care | Payer: Self-pay | Source: Ambulatory Visit | Attending: Gastroenterology

## 2023-11-19 ENCOUNTER — Encounter: Payer: Self-pay | Admitting: *Deleted

## 2023-11-19 DIAGNOSIS — K219 Gastro-esophageal reflux disease without esophagitis: Secondary | ICD-10-CM | POA: Insufficient documentation

## 2023-11-19 DIAGNOSIS — K635 Polyp of colon: Secondary | ICD-10-CM | POA: Diagnosis not present

## 2023-11-19 DIAGNOSIS — K529 Noninfective gastroenteritis and colitis, unspecified: Secondary | ICD-10-CM | POA: Diagnosis not present

## 2023-11-19 DIAGNOSIS — Z7985 Long-term (current) use of injectable non-insulin antidiabetic drugs: Secondary | ICD-10-CM | POA: Insufficient documentation

## 2023-11-19 DIAGNOSIS — J4520 Mild intermittent asthma, uncomplicated: Secondary | ICD-10-CM | POA: Insufficient documentation

## 2023-11-19 DIAGNOSIS — F17200 Nicotine dependence, unspecified, uncomplicated: Secondary | ICD-10-CM | POA: Insufficient documentation

## 2023-11-19 DIAGNOSIS — F17210 Nicotine dependence, cigarettes, uncomplicated: Secondary | ICD-10-CM | POA: Diagnosis not present

## 2023-11-19 DIAGNOSIS — Z7984 Long term (current) use of oral hypoglycemic drugs: Secondary | ICD-10-CM | POA: Diagnosis not present

## 2023-11-19 DIAGNOSIS — E119 Type 2 diabetes mellitus without complications: Secondary | ICD-10-CM | POA: Diagnosis not present

## 2023-11-19 DIAGNOSIS — I10 Essential (primary) hypertension: Secondary | ICD-10-CM | POA: Diagnosis not present

## 2023-11-19 DIAGNOSIS — K5100 Ulcerative (chronic) pancolitis without complications: Secondary | ICD-10-CM | POA: Insufficient documentation

## 2023-11-19 DIAGNOSIS — G4730 Sleep apnea, unspecified: Secondary | ICD-10-CM | POA: Insufficient documentation

## 2023-11-19 DIAGNOSIS — K640 First degree hemorrhoids: Secondary | ICD-10-CM | POA: Insufficient documentation

## 2023-11-19 DIAGNOSIS — K649 Unspecified hemorrhoids: Secondary | ICD-10-CM | POA: Diagnosis not present

## 2023-11-19 HISTORY — DX: Diarrhea, unspecified: R19.7

## 2023-11-19 HISTORY — DX: Pain in right knee: M25.562

## 2023-11-19 HISTORY — DX: Herpesviral vesicular dermatitis: B00.1

## 2023-11-19 HISTORY — PX: BIOPSY: SHX5522

## 2023-11-19 HISTORY — DX: Snoring: R06.83

## 2023-11-19 HISTORY — DX: Erythema intertrigo: L30.4

## 2023-11-19 HISTORY — PX: COLONOSCOPY WITH PROPOFOL: SHX5780

## 2023-11-19 HISTORY — DX: Hemorrhage of anus and rectum: K62.5

## 2023-11-19 HISTORY — DX: Pain in left knee: M25.561

## 2023-11-19 SURGERY — COLONOSCOPY WITH PROPOFOL
Anesthesia: General

## 2023-11-19 MED ORDER — PROPOFOL 500 MG/50ML IV EMUL
INTRAVENOUS | Status: DC | PRN
  Administered 2023-11-19: 125 ug/kg/min via INTRAVENOUS

## 2023-11-19 MED ORDER — LIDOCAINE HCL (CARDIAC) PF 100 MG/5ML IV SOSY
PREFILLED_SYRINGE | INTRAVENOUS | Status: DC | PRN
  Administered 2023-11-19: 20 mg via INTRAVENOUS

## 2023-11-19 MED ORDER — SODIUM CHLORIDE 0.9 % IV SOLN: INTRAVENOUS | Status: DC

## 2023-11-19 MED ORDER — PROPOFOL 10 MG/ML IV BOLUS
INTRAVENOUS | Status: DC | PRN
  Administered 2023-11-19: 50 mg via INTRAVENOUS
  Administered 2023-11-19: 100 mg via INTRAVENOUS

## 2023-11-19 NOTE — Transfer of Care (Signed)
 Immediate Anesthesia Transfer of Care Note  Patient: Ronald Sparks  Procedure(s) Performed: COLONOSCOPY WITH PROPOFOL  BIOPSY  Patient Location: PACU  Anesthesia Type:General  Level of Consciousness: drowsy  Airway & Oxygen Therapy: Patient Spontanous Breathing  Post-op Assessment: Report given to RN and Post -op Vital signs reviewed and stable  Post vital signs: Reviewed and stable  Last Vitals:  Vitals Value Taken Time  BP 100/70 11/19/23 0917  Temp 36.1 C 11/19/23 0917  Pulse 70 11/19/23 0918  Resp 12 11/19/23 0918  SpO2 98 % 11/19/23 0918  Vitals shown include unfiled device data.  Last Pain:  Vitals:   11/19/23 0917  TempSrc: Temporal  PainSc: Asleep         Complications: No notable events documented.
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
## 2023-11-19 NOTE — Interval H&P Note (Signed)
 History and Physical Interval Note:  11/19/2023 8:53 AM  Ronald Sparks  has presented today for surgery, with the diagnosis of Colitis.  The various methods of treatment have been discussed with the patient and family. After consideration of risks, benefits and other options for treatment, the patient has consented to  Procedure(s): COLONOSCOPY WITH PROPOFOL  (N/A) as a surgical intervention.  The patient's history has been reviewed, patient examined, no change in status, stable for surgery.  I have reviewed the patient's chart and labs.  Questions were answered to the patient's satisfaction.     [name]  Ok to proceed with colonoscopy

## 2023-11-19 NOTE — Anesthesia Postprocedure Evaluation (Signed)
 Anesthesia Post Note  Patient: Ronald Sparks  Procedure(s) Performed: COLONOSCOPY WITH PROPOFOL  BIOPSY  Patient location during evaluation: Endoscopy Anesthesia Type: General Level of consciousness: awake and alert Pain management: pain level controlled Vital Signs Assessment: post-procedure vital signs reviewed and stable Respiratory status: spontaneous breathing, nonlabored ventilation and respiratory function stable Cardiovascular status: blood pressure returned to baseline and stable Postop Assessment: no apparent nausea or vomiting Anesthetic complications: no   No notable events documented.   Last Vitals:  Vitals:   11/19/23 0917 11/19/23 0937  BP: 100/70   Pulse: 71 73  Resp: 12   Temp: (!) 36.1 C   SpO2: 98% 100%    Last Pain:  Vitals:   11/19/23 0937  TempSrc:   PainSc: 0-No pain                 Camellia Merilee Louder
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
## 2023-11-19 NOTE — Op Note (Signed)
 [facility] Patient Name: Ronald Sparks Procedure Date: 11/19/2023 8:44 AM MRN: [id number] Account #: [id number] Date of Birth: 30-Aug-1976 Admit Type: Outpatient Age: 47 Room: [facility] ENDO ROOM 1 Gender: Male Note Status: Finalized Instrument Name: Arvis [id number] Procedure:             Colonoscopy Indications:           Chronic ulcerative pancolitis Providers:             Ole Schick MD, MD Referring MD:          Eva DOROTHA Crimes, MD (Referring MD) Medicines:             Monitored Anesthesia Care Complications:         No immediate complications. Estimated blood loss:                         Minimal. Procedure:             Pre-Anesthesia Assessment:                        - Prior to the procedure, a History and Physical was                         performed, and patient medications and allergies were                         reviewed. The patient is competent. The risks and                         benefits of the procedure and the sedation options and                         risks were discussed with the patient. All questions                         were answered and informed consent was obtained.                         Patient identification and proposed procedure were                         verified by the physician, the nurse, the                         anesthesiologist, the anesthetist and the technician                         in the endoscopy suite. Mental Status Examination:                         alert and oriented. Airway Examination: normal                         oropharyngeal airway and neck mobility. Respiratory                         Examination: clear to auscultation. CV Examination:  normal. Prophylactic Antibiotics: The patient does not                         require prophylactic antibiotics. Prior                         Anticoagulants: The patient has taken no anticoagulant                          or antiplatelet agents. ASA Grade Assessment: III - A                         patient with severe systemic disease. After reviewing                         the risks and benefits, the patient was deemed in                         satisfactory condition to undergo the procedure. The                         anesthesia plan was to use monitored anesthesia care                         (MAC). Immediately prior to administration of                         medications, the patient was re-assessed for adequacy                         to receive sedatives. The heart rate, respiratory                         rate, oxygen saturations, blood pressure, adequacy of                         pulmonary ventilation, and response to care were                         monitored throughout the procedure. The physical                         status of the patient was re-assessed after the                         procedure.                        After obtaining informed consent, the colonoscope was                         passed under direct vision. Throughout the procedure,                         the patient's blood pressure, pulse, and oxygen                         saturations were monitored continuously. The  Colonoscope was introduced through the anus and                         advanced to the the terminal ileum, with                         identification of the appendiceal orifice and IC                         valve. The colonoscopy was performed without                         difficulty. The patient tolerated the procedure well.                         The quality of the bowel preparation was good. The                         terminal ileum, ileocecal valve, appendiceal orifice,                         and rectum were photographed. Findings:      The perianal and digital rectal examinations were normal.      The terminal ileum appeared normal.      A 4 mm polyp  was found in the ascending colon. The polyp was flat. The       polyp was removed with a cold snare. Resection and retrieval were       complete. Estimated blood loss was minimal.      Inflammation was not found based on the endoscopic appearance of the       mucosa in the colon. This was graded as Mayo Score 0 (normal or inactive       disease), and when compared to the previous examination, the findings       are improved. Biopsies were taken with a cold forceps for histology.       Estimated blood loss was minimal.      Internal hemorrhoids were found during retroflexion. The hemorrhoids       were Grade I (internal hemorrhoids that do not prolapse).      The exam was otherwise without abnormality on direct and retroflexion       views. Impression:            - The examined portion of the ileum was normal.                        - One 4 mm polyp in the ascending colon, removed with                         a cold snare. Resected and retrieved.                        - Inactive (Mayo Score 0) ulcerative colitis, improved                         since the last examination. Biopsied.                        - Internal hemorrhoids.                        -  The examination was otherwise normal on direct and                         retroflexion views. Recommendation:        - Discharge patient to home.                        - Resume previous diet.                        - Continue present medications.                        - Await pathology results.                        - Repeat colonoscopy for surveillance based on                         pathology results.                        - Return to referring physician as previously                         scheduled. Procedure Code(s):     --- Professional ---                        [phone], Colonoscopy, flexible; with removal of                         tumor(s), polyp(s), or other lesion(s) by snare                         technique                         45380, 59, Colonoscopy, flexible; with biopsy, single                         or multiple Diagnosis Code(s):     --- Professional ---                        K64.0, First degree hemorrhoids                        D12.2, Benign neoplasm of ascending colon                        K51.00, Ulcerative (chronic) pancolitis without                         complications CPT copyright 2022 American Medical Association. All rights reserved. The codes documented in this report are preliminary and upon coder review may  be revised to meet current compliance requirements. Ole Schick MD, MD 11/19/2023 9:20:20 AM Number of Addenda: 0 Note Initiated On: 11/19/2023 8:44 AM Scope Withdrawal Time: 0 hours 9 minutes 22 seconds  Total Procedure Duration: 0 hours 12 minutes 11 seconds  Estimated Blood Loss:  Estimated blood loss was minimal.      [facility]

## 2023-11-21 ENCOUNTER — Encounter: Payer: Self-pay | Admitting: Gastroenterology

## 2023-11-27 LAB — SURGICAL PATHOLOGY

## 2023-12-16 DIAGNOSIS — Z008 Encounter for other general examination: Secondary | ICD-10-CM | POA: Diagnosis not present

## 2024-01-13 IMAGING — US US EXTREM LOW VENOUS*L*
1 series · 13 of 24 positions shown · non-contrast
Comparison: None.

CLINICAL DATA: Pain and swelling for 5 days

EXAM:
LEFT LOWER EXTREMITY VENOUS DOPPLER ULTRASOUND
TECHNIQUE: Gray-scale sonography with graded compression, as well as color
Doppler and duplex ultrasound were performed to evaluate the lower
extremity deep venous systems from the level of the common femoral
vein and including the common femoral, femoral, profunda femoral,
popliteal and calf veins including the posterior tibial, peroneal
and gastrocnemius veins when visible. Spectral Doppler was utilized
to evaluate flow at rest and with distal augmentation maneuvers in
the common femoral, femoral and popliteal veins.

[Series 1: us extrem low venous*left* · 0.11mm/px · 13 of 28 slices shown]
[im 1/28]
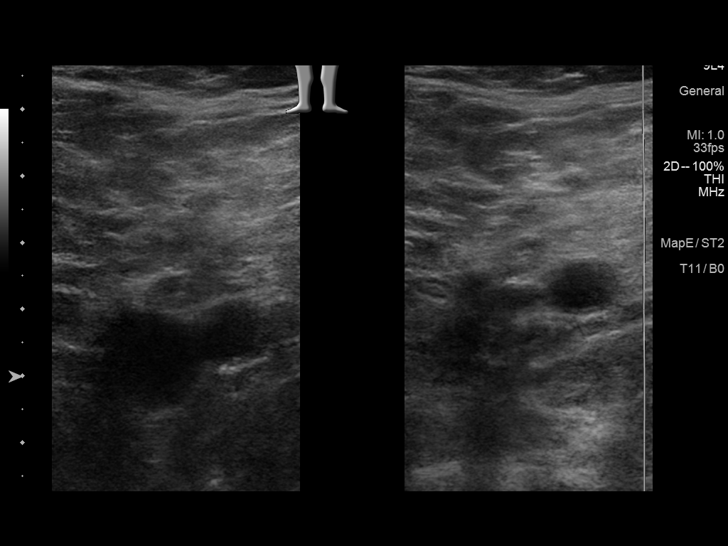
[im 3/28]
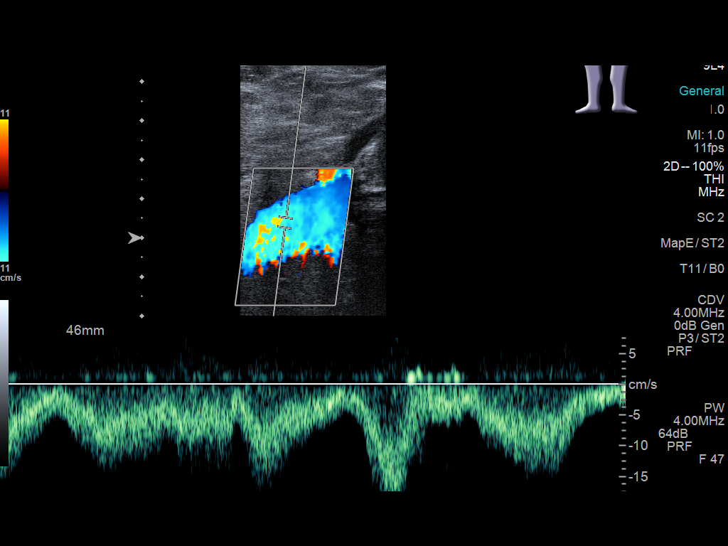
[im 5/28]
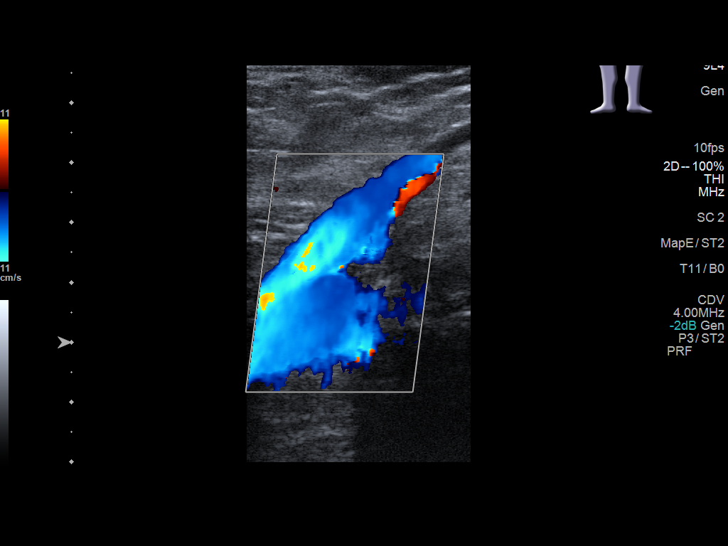
[im 8/28]
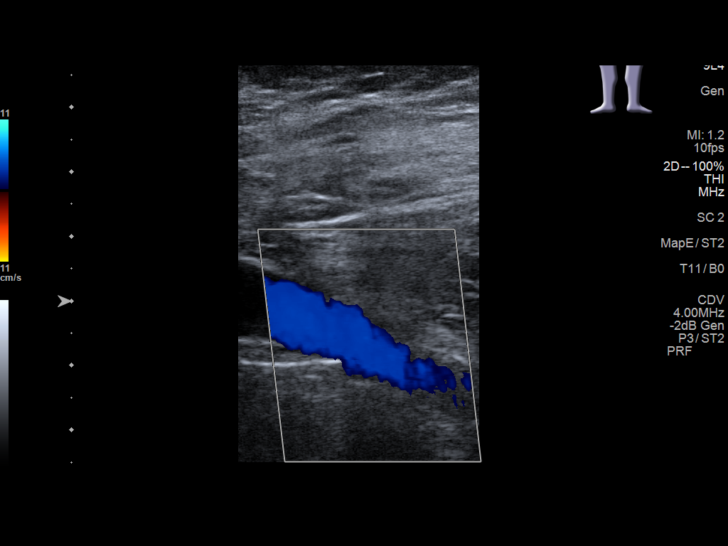
[im 10/28]
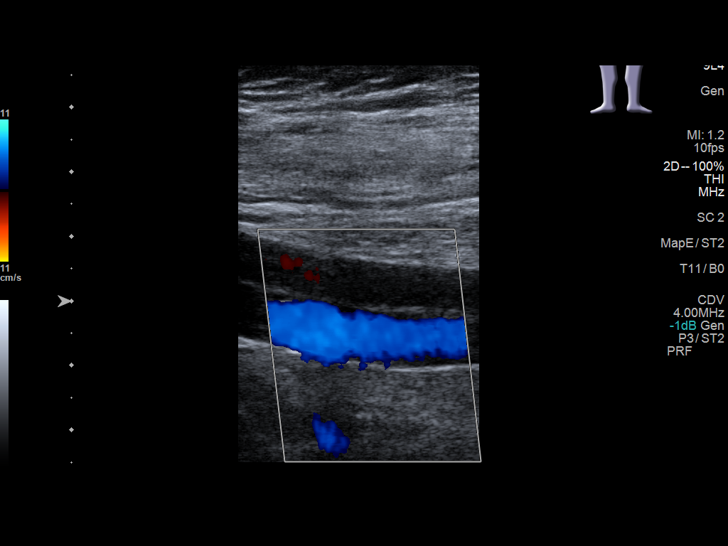
[im 12/28]
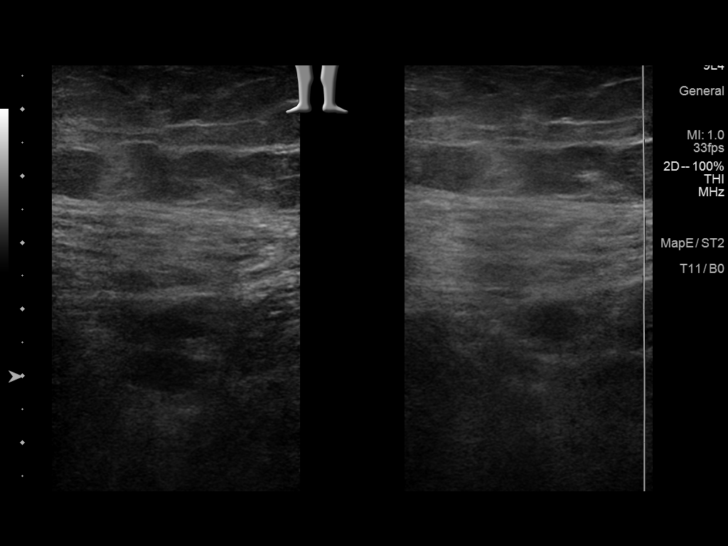
[im 15/28]
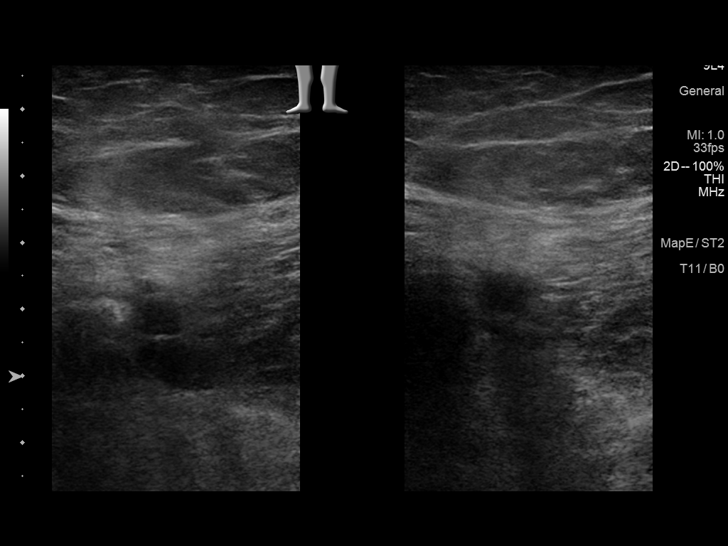
[im 16/28]
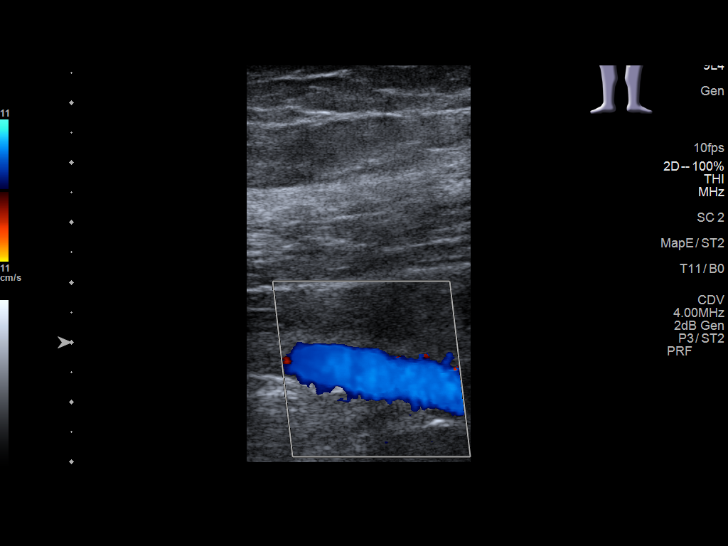
[im 18/28]
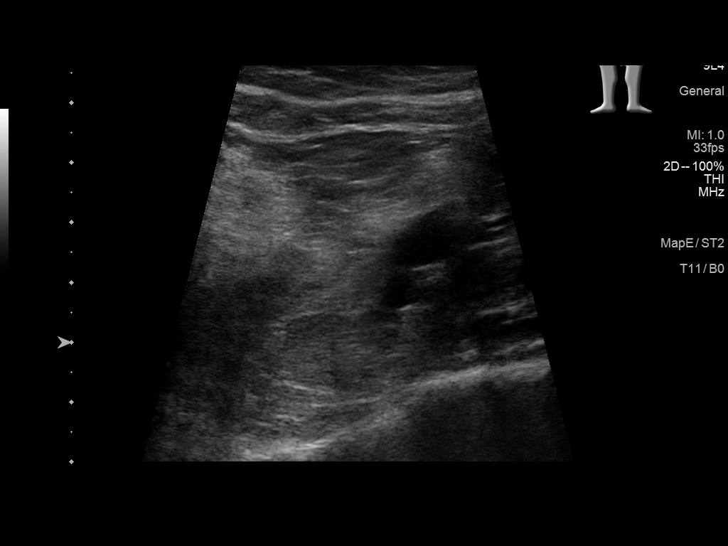
[im 20/28]
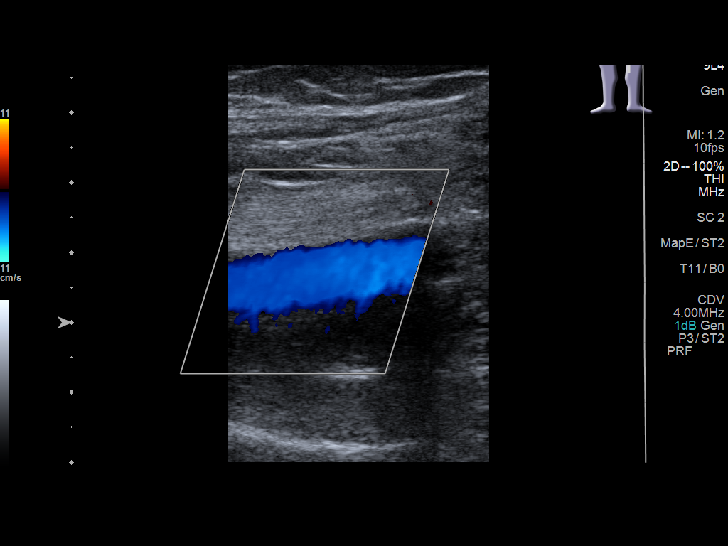
[im 23/28]
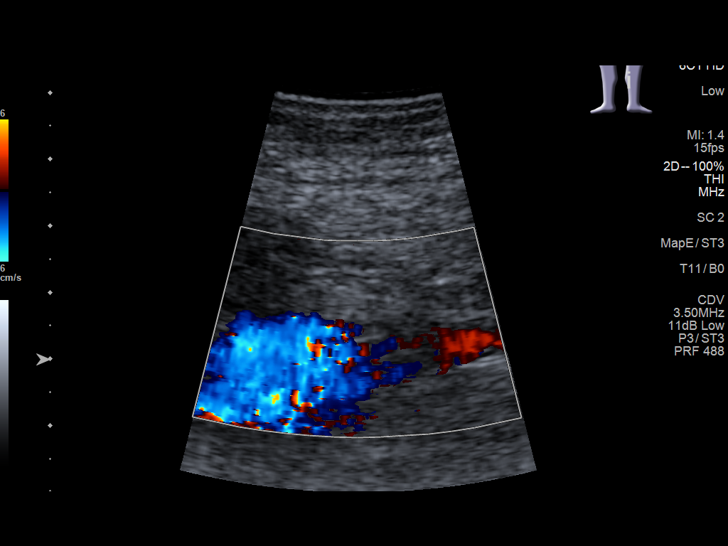
[im 25/28]
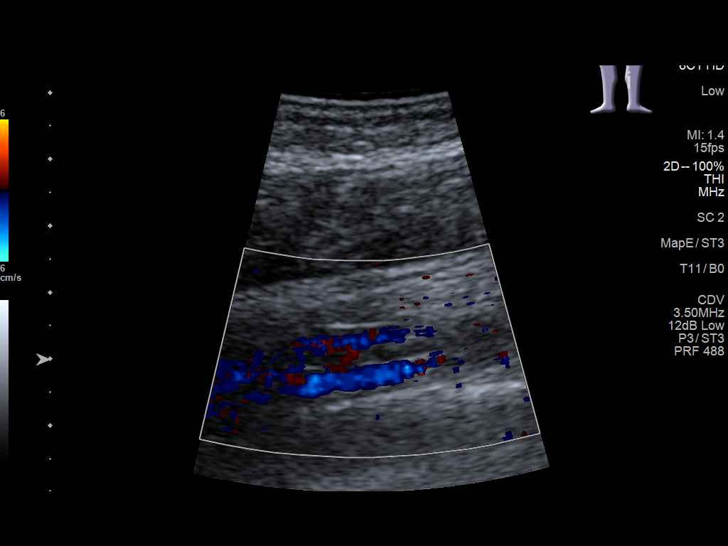
[im 28/28]
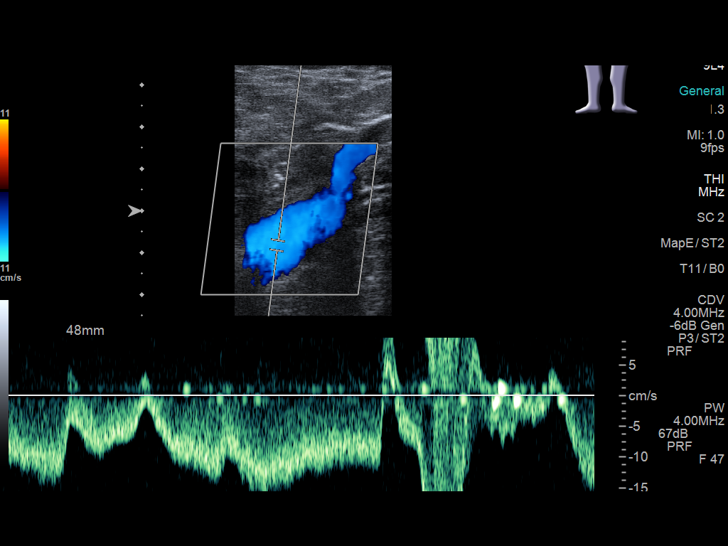

[13 of 24 positions shown; findings below may reference images not displayed]

FINDINGS: Contralateral Common Femoral Vein: Respiratory phasicity is normal
and symmetric with the symptomatic side. No evidence of thrombus.
Normal compressibility.

Common Femoral Vein: No evidence of thrombus. Normal
compressibility, respiratory phasicity and response to augmentation.

Saphenofemoral Junction: No evidence of thrombus. Normal
compressibility and flow on color Doppler imaging.

Profunda Femoral Vein: No evidence of thrombus. Normal
compressibility and flow on color Doppler imaging.

Femoral Vein: No evidence of thrombus. Normal compressibility,
respiratory phasicity and response to augmentation.

Popliteal Vein: No evidence of thrombus. Normal compressibility,
respiratory phasicity and response to augmentation.

Calf Veins: No evidence of thrombus. Normal compressibility and flow
on color Doppler imaging.
IMPRESSION: No evidence of deep venous thrombosis.

## 2024-09-26 ENCOUNTER — Other Ambulatory Visit: Payer: Self-pay

## 2024-09-26 ENCOUNTER — Encounter: Payer: Self-pay | Admitting: Oncology

## 2024-09-26 ENCOUNTER — Emergency Department
Admission: EM | Admit: 2024-09-26 | Discharge: 2024-09-26 | Disposition: A | Discharge status: Home / Self Care | Attending: Emergency Medicine | Admitting: Emergency Medicine

## 2024-09-26 ENCOUNTER — Emergency Department

## 2024-09-26 DIAGNOSIS — E119 Type 2 diabetes mellitus without complications: Secondary | ICD-10-CM | POA: Diagnosis not present

## 2024-09-26 DIAGNOSIS — I10 Essential (primary) hypertension: Secondary | ICD-10-CM | POA: Diagnosis not present

## 2024-09-26 DIAGNOSIS — R079 Chest pain, unspecified: Secondary | ICD-10-CM

## 2024-09-26 DIAGNOSIS — R0602 Shortness of breath: Secondary | ICD-10-CM | POA: Diagnosis present

## 2024-09-26 DIAGNOSIS — J45901 Unspecified asthma with (acute) exacerbation: Secondary | ICD-10-CM | POA: Insufficient documentation

## 2024-09-26 DIAGNOSIS — Z7984 Long term (current) use of oral hypoglycemic drugs: Secondary | ICD-10-CM | POA: Diagnosis not present

## 2024-09-26 LAB — BASIC METABOLIC PANEL WITH GFR
Anion gap: 7 (ref 5–15)
BUN: 14 mg/dL (ref 6–20)
CO2: 25 mmol/L (ref 22–32)
Calcium: 9 mg/dL (ref 8.9–10.3)
Chloride: 105 mmol/L (ref 98–111)
Creatinine, Ser: 0.76 mg/dL (ref 0.61–1.24)
GFR, Estimated: >60 mL/min (ref >60)
Glucose, Bld: 103 mg/dL — ABNORMAL HIGH (ref 70–99)
Potassium: 3.8 mmol/L (ref 3.5–5.1)
Sodium: 137 mmol/L (ref 135–145)

## 2024-09-26 LAB — CBC
HCT: 41.6 % (ref 39.0–52.0)
Hemoglobin: 13.8 g/dL (ref 13.0–17.0)
MCH: 27.8 pg (ref 26.0–34.0)
MCHC: 33.2 g/dL (ref 30.0–36.0)
MCV: 83.9 fL (ref 80.0–100.0)
Platelets: 223 K/uL (ref 150–400)
RBC: 4.96 MIL/uL (ref 4.22–5.81)
RDW: 13.1 % (ref 11.5–15.5)
WBC: 10 K/uL (ref 4.0–10.5)
nRBC: 0 % (ref 0.0–0.2)

## 2024-09-26 LAB — TROPONIN I (HIGH SENSITIVITY)
Troponin I (High Sensitivity): 10 ng/L (ref <18)
Troponin I (High Sensitivity): 10 ng/L (ref <18)

## 2024-09-26 MED ORDER — PREDNISONE 20 MG PO TABS: 40.0000 mg | ORAL_TABLET | Freq: Every day | ORAL | 0 refills | Status: AC

## 2024-09-26 MED ORDER — PREDNISONE 20 MG PO TABS
40.0000 mg | ORAL_TABLET | Freq: Once | ORAL | Status: AC
  Administered 2024-09-26: 40 mg via ORAL
  Filled 2024-09-26: qty 2

## 2024-09-26 MED ORDER — IPRATROPIUM-ALBUTEROL 0.5-2.5 (3) MG/3ML IN SOLN
3.0000 mL | Freq: Once | RESPIRATORY_TRACT | Status: AC
  Administered 2024-09-26: 3 mL via RESPIRATORY_TRACT
  Filled 2024-09-26: qty 3

## 2024-09-26 NOTE — ED Provider Notes (Signed)
-----------------------------------------   4:42 PM on 09/26/2024 -----------------------------------------  Blood pressure (!) 147/95, pulse 65, temperature 98.7 F (37.1 C), temperature source Oral, resp. rate 17, height 5' 8 (1.727 m), weight 114.3 kg, SpO2 100%.  Assuming care from Medford Amber, PA-C/NP-C.  In short, Ronald Sparks is a 48 y.o. male with a chief complaint of Chest Pain and Shortness of Breath .  Refer to the original H&P for additional details.  The current plan of care is to await his pending repeat troponin and disposition the patient accordingly.  ____________________________________________    ED Results / Procedures / Treatments   Labs (all labs ordered are listed, but only abnormal results are displayed) Labs Reviewed  BASIC METABOLIC PANEL WITH GFR - Abnormal; Notable for the following components:      Result Value   Glucose, Bld 103 (*)    All other components within normal limits  CBC  TROPONIN I (HIGH SENSITIVITY)  TROPONIN I (HIGH SENSITIVITY)    EKG   RADIOLOGY  I personally viewed and evaluated these images as part of my medical decision making, as well as reviewing the written report by the radiologist.  ED Provider Interpretation: No acute findings}  DG Chest 2 View Result Date: 09/26/2024 CLINICAL DATA:  Shortness of breath, chest pain, and diaphoresis. EXAM: CHEST - 2 VIEW COMPARISON:  05/27/2022. FINDINGS: The heart size and mediastinal contours are within normal limits. No consolidation, effusion, or pneumothorax is seen. Degenerative changes are present in the thoracic spine. No acute osseous abnormality. IMPRESSION: No active cardiopulmonary disease. Electronically Signed   By: Leita Birmingham M.D.   On: 09/26/2024 14:09     PROCEDURES:  Critical Care performed: No  Procedures   MEDICATIONS ORDERED IN ED: Medications  predniSONE (DELTASONE) tablet 40 mg (has no administration in time range)  ipratropium-albuterol (DUONEB)  0.5-2.5 (3) MG/3ML nebulizer solution 3 mL (3 mLs Nebulization Given 09/26/24 1457)     IMPRESSION / MDM / ASSESSMENT AND PLAN / ED COURSE  I reviewed the triage vital signs and the nursing notes.                              Differential diagnosis includes, but is not limited to, asthma, COPD, viral URI, ACS  Patient's presentation is most consistent with acute complicated illness / injury requiring diagnostic workup.  Patient's diagnosis is consistent with nonspecific chest pain and shortness of breath likely representing a mild asthma exacerbation. Patient will be discharged home with prescriptions for prednisone.  Patient is to follow up with his PCP as scheduled as needed or otherwise directed. Patient is given ED precautions to return to the ED for any worsening or new symptoms.    FINAL CLINICAL IMPRESSION(S) / ED DIAGNOSES   Final diagnoses:  Shortness of breath  Mild asthma with exacerbation, unspecified whether persistent  Chest pain, unspecified type     Rx / DC Orders   ED Discharge Orders          Ordered    predniSONE (DELTASONE) 20 MG tablet  Daily with breakfast        09/26/24 1535             Note:  This document was prepared using Dragon voice recognition software and may include unintentional dictation errors.    Loyd Candida LULLA Aldona, PA-C 09/26/24 1644    Nicholaus Rolland BRAVO, MD 09/26/24 [phone]

## 2024-09-26 NOTE — ED Triage Notes (Signed)
 PT c/o CP and diaphoresis today, SHOB x2-3 weeks. Pt states child had cold recently. Pt AOX4, denies cough, congestion. Pt scheduled to see PCP.

## 2024-09-26 NOTE — Discharge Instructions (Addendum)
 Please continue with albuterol inhaler every 6 hours as needed.  Use prednisone as prescribed daily for 4 days.  Follow-up with PCP provider on Monday.  Return to the ER for any shortness of breath, wheezing, chest tightness, chest pain, nausea vomiting weakness or any urgent changes in your health.

## 2024-09-26 NOTE — ED Provider Notes (Signed)
 [place] EMERGENCY DEPARTMENT AT [facility] Provider Note   CSN: [id number] Arrival date & time: 09/26/24  1256     Patient presents with: Chest Pain and Shortness of Breath   Ronald Sparks is a 48 y.o. male with history of chronic shoulder pain, hypertension, controlled type 2 diabetes presents to the emergency department for evaluation of chest pain and shortness of breath.  He describes not being able to take a good deep breath for 2 weeks.  He has had some pressure in his chest and left shoulder pain as well that started today. Chest tightness has been present for a couple of weeks.  No cough, shortness of breath.  He has been using albuterol inhaler twice a day over the last 2 weeks with mild relief.  Denies any lower extremity swelling edema or history of blood clots.  Today around 12:00 he had some chest pain, pressure and left shoulder pain for the first time.  No neck pain numbness [place] radicular symptoms.  Patient states he was not  performing any physical activity when his symptoms occurred and they lasted roughly 30 minutes.  He is currently asymptomatic except for tightness in his chest and unable to take a good deep breath.     Prior to Admission medications   Medication Sig Start Date End Date Taking? Authorizing Provider  predniSONE (DELTASONE) 20 MG tablet Take 2 tablets (40 mg total) by mouth daily with breakfast for 4 days. 09/26/24 09/30/24 Yes Charlene Debby BROCKS, PA-C  albuterol (VENTOLIN HFA) 108 (90 Base) MCG/ACT inhaler Inhale 2 puffs into the lungs every 6 (six) hours as needed for wheezing or shortness of breath.    [provider]  Dulaglutide 1.5 MG/0.5ML SOAJ Inject 3 mg into the skin once a week.    [provider]  ferrous sulfate 325 (65 FE) MG EC tablet Take by mouth. 06/09/20 06/09/21  [provider]  glucose blood test strip Use 2 (two) times daily Use as instructed. BAYER CONTOUR NEXT TEST STRIPS E11.9 03/18/19    [provider]  Ibuprofen  200 MG CAPS Take by mouth.    [provider]  mesalamine (APRISO) 0.375 g 24 hr capsule Take 375 mg by mouth daily. TAKE 4 CAPSULES BY MOUTH    [provider]  pantoprazole (PROTONIX) 40 MG tablet Take 40 mg by mouth daily.    [provider]  pioglitazone (ACTOS) 30 MG tablet Take 30 mg by mouth daily. 09/28/20   [provider]  rosuvastatin (CRESTOR) 5 MG tablet Take 5 mg by mouth daily.    [provider]  sildenafil (VIAGRA) 50 MG tablet Take 50 mg by mouth daily as needed for erectile dysfunction.    [provider]  sitaGLIPtin (JANUVIA) 100 MG tablet Take by mouth. Patient not taking: Reported on 11/19/2023 07/28/19   [provider]  valACYclovir (VALTREX) 1000 MG tablet Take 1,000 mg by mouth 2 (two) times daily. Patient not taking: Reported on 11/19/2023    [provider]    Allergies: Aspirin    Review of Systems  Updated Vital Signs BP (!) 147/95   Pulse 65   Temp 98.7 F (37.1 C) (Oral)   Resp 17   Ht 5' 8 (1.727 m)   Wt 114.3 kg   SpO2 100%   BMI 38.32 kg/m   Physical Exam Constitutional:      Appearance: He is well-developed.  HENT:     Head: Normocephalic and atraumatic.  Eyes:  Extraocular Movements: Extraocular movements intact.     Conjunctiva/sclera: Conjunctivae normal.     Pupils: Pupils are equal, round, and reactive to light.  Cardiovascular:     Rate and Rhythm: Normal rate and regular rhythm.     Pulses: Normal pulses.     Heart sounds: Normal heart sounds. No murmur heard. Pulmonary:     Effort: Pulmonary effort is normal. No respiratory distress.     Breath sounds: Wheezing present. No rales.  Chest:     Chest wall: No tenderness.  Abdominal:     General: Bowel sounds are normal.     Tenderness: There is no right CVA tenderness or guarding.  Musculoskeletal:        General: No swelling, tenderness or deformity. Normal range of  motion.     Cervical back: Normal range of motion.     Right lower leg: No edema.     Left lower leg: No edema.     Comments: Negative [place]' sign bilaterally.  No edema.  Skin:    General: Skin is warm.     Capillary Refill: Capillary refill takes less than 2 seconds.     Findings: No rash.  Neurological:     General: No focal deficit present.     Mental Status: He is alert and oriented to person, place, and time.     Cranial Nerves: No cranial nerve deficit.     Motor: No weakness.     Gait: Gait normal.  Psychiatric:        Mood and Affect: Mood normal.        Behavior: Behavior normal.        Thought Content: Thought content normal.     (all labs ordered are listed, but only abnormal results are displayed) Labs Reviewed  BASIC METABOLIC PANEL WITH GFR - Abnormal; Notable for the following components:      Result Value   Glucose, Bld 103 (*)    All other components within normal limits  CBC  TROPONIN I (HIGH SENSITIVITY)  TROPONIN I (HIGH SENSITIVITY)    EKG: None  Radiology: DG Chest 2 View Result Date: 09/26/2024 CLINICAL DATA:  Shortness of breath, chest pain, and diaphoresis. EXAM: CHEST - 2 VIEW COMPARISON:  05/27/2022. FINDINGS: The heart size and mediastinal contours are within normal limits. No consolidation, effusion, or pneumothorax is seen. Degenerative changes are present in the thoracic spine. No acute osseous abnormality. IMPRESSION: No active cardiopulmonary disease. Electronically Signed   By: Leita Birmingham M.D.   On: 09/26/2024 14:09     Procedures   Medications Ordered in the ED  predniSONE (DELTASONE) tablet 40 mg (has no administration in time range)  ipratropium-albuterol (DUONEB) 0.5-2.5 (3) MG/3ML nebulizer solution 3 mL (3 mLs Nebulization Given 09/26/24 1457)                                    Medical Decision Making Amount and/or Complexity of Data Reviewed Labs: ordered. Radiology: ordered.  Risk Prescription drug  management.   48 year old male presents to the emergency department valuation of chest tightness, shortness of breath, left-sided chest pain.  On exam decreased air movement and slight wheezing.  He was given DuoNeb treatment with complete resolution and improved air movement bilaterally.  Patient denies having any shortness of breath or chest tightness after treatment.  He is started on prednisone 40 mg daily for 5 days.  His vital  signs have been stable, nontachycardic with normal O2 sats and normal blood pressure.  No signs of DVT or PE.  His chest x-ray was negative for any acute cardiopulmonary process.  EKG showing no ST elevation.  Troponin normal.  CBC and BMP within normal limits.  Patient has been asymptomatic while being here in the emergency department.  He has no cardiac history.  We are going to recheck a repeat troponin.  Based on ED chest pain algorithm/heart pathway score if repeat troponin within normal limits patient stable for discharge to home with close outpatient follow-up.  He has outpatient follow-up scheduled for Monday with PCP.  At this time, care transferred to fellow PA-C. We will await repeat troponin levels and reassess.   Final diagnoses:  Shortness of breath  Mild asthma with exacerbation, unspecified whether persistent  Chest pain, unspecified type    ED Discharge Orders          Ordered    predniSONE (DELTASONE) 20 MG tablet  Daily with breakfast        09/26/24 1535               Brea Coleson C, PA-C 09/26/24 1541    Arlander Charleston, MD 09/28/24 [phone]

## 2024-09-28 ENCOUNTER — Other Ambulatory Visit: Payer: Self-pay | Admitting: Physician Assistant

## 2024-09-28 DIAGNOSIS — R0789 Other chest pain: Secondary | ICD-10-CM

## 2024-10-02 ENCOUNTER — Ambulatory Visit
Admission: RE | Admit: 2024-10-02 | Discharge: 2024-10-02 | Disposition: A | Discharge status: Home / Self Care | Payer: Self-pay | Source: Ambulatory Visit | Attending: Physician Assistant | Admitting: Physician Assistant

## 2024-10-02 DIAGNOSIS — R0789 Other chest pain: Secondary | ICD-10-CM | POA: Insufficient documentation
